# Patient Record
Sex: Female | Born: 1955 | Race: Black or African American | Hispanic: No | Smoking: Never smoker
Health system: Southern US, Community
[De-identification: ages and names within clinical notes are randomized; demographics above are authoritative.]

## PROBLEM LIST (undated history)

## (undated) DIAGNOSIS — N852 Hypertrophy of uterus: Secondary | ICD-10-CM

## (undated) DIAGNOSIS — N950 Postmenopausal bleeding: Secondary | ICD-10-CM

## (undated) DIAGNOSIS — T8859XA Other complications of anesthesia, initial encounter: Secondary | ICD-10-CM

## (undated) DIAGNOSIS — E66813 Obesity, class 3: Secondary | ICD-10-CM

## (undated) DIAGNOSIS — C541 Malignant neoplasm of endometrium: Secondary | ICD-10-CM

## (undated) DIAGNOSIS — L293 Anogenital pruritus, unspecified: Secondary | ICD-10-CM

## (undated) HISTORY — DX: Anogenital pruritus, unspecified: L29.3

## (undated) HISTORY — DX: Malignant neoplasm of endometrium: C54.1

## (undated) HISTORY — DX: Morbid (severe) obesity due to excess calories: E66.01

## (undated) HISTORY — DX: Postmenopausal bleeding: N95.0

## (undated) HISTORY — DX: Hypertrophy of uterus: N85.2

## (undated) HISTORY — DX: Obesity, class 3: E66.813

---

## 2000-05-12 ENCOUNTER — Emergency Department (HOSPITAL_COMMUNITY): Admission: EM | Admit: 2000-05-12 | Discharge: 2000-05-12 | Payer: Self-pay | Admitting: *Deleted

## 2005-06-12 ENCOUNTER — Emergency Department (HOSPITAL_COMMUNITY): Admission: EM | Admit: 2005-06-12 | Discharge: 2005-06-12 | Payer: Self-pay | Admitting: Emergency Medicine

## 2005-06-20 ENCOUNTER — Emergency Department (HOSPITAL_COMMUNITY): Admission: EM | Admit: 2005-06-20 | Discharge: 2005-06-20 | Payer: Self-pay | Admitting: Emergency Medicine

## 2016-12-26 ENCOUNTER — Encounter (HOSPITAL_COMMUNITY): Payer: Self-pay | Admitting: Emergency Medicine

## 2016-12-26 ENCOUNTER — Emergency Department (HOSPITAL_COMMUNITY): Payer: No Typology Code available for payment source

## 2016-12-26 ENCOUNTER — Emergency Department (HOSPITAL_COMMUNITY)
Admission: EM | Admit: 2016-12-26 | Discharge: 2016-12-26 | Disposition: A | Discharge status: Home / Self Care | Payer: No Typology Code available for payment source | Attending: Emergency Medicine | Admitting: Emergency Medicine

## 2016-12-26 DIAGNOSIS — Y999 Unspecified external cause status: Secondary | ICD-10-CM | POA: Insufficient documentation

## 2016-12-26 DIAGNOSIS — S39012A Strain of muscle, fascia and tendon of lower back, initial encounter: Secondary | ICD-10-CM | POA: Diagnosis not present

## 2016-12-26 DIAGNOSIS — S3992XA Unspecified injury of lower back, initial encounter: Secondary | ICD-10-CM | POA: Diagnosis present

## 2016-12-26 DIAGNOSIS — Y939 Activity, unspecified: Secondary | ICD-10-CM | POA: Diagnosis not present

## 2016-12-26 DIAGNOSIS — Y92410 Unspecified street and highway as the place of occurrence of the external cause: Secondary | ICD-10-CM | POA: Diagnosis not present

## 2016-12-26 MED ORDER — TRAMADOL HCL 50 MG PO TABS: 50.0000 mg | ORAL_TABLET | Freq: Four times a day (QID) | ORAL | 0 refills | Status: DC | PRN

## 2016-12-26 NOTE — ED Notes (Signed)
EDP aware of bp and says he discussed pt's bp with pt.

## 2016-12-26 NOTE — Discharge Instructions (Signed)
Follow up with a family md or dr. Romeo Apple if any problems
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
## 2016-12-29 ENCOUNTER — Encounter (HOSPITAL_COMMUNITY): Payer: Self-pay | Admitting: Adult Health

## 2016-12-29 ENCOUNTER — Emergency Department (HOSPITAL_COMMUNITY)
Admission: EM | Admit: 2016-12-29 | Discharge: 2016-12-29 | Disposition: A | Discharge status: Home / Self Care | Payer: No Typology Code available for payment source | Attending: Emergency Medicine | Admitting: Emergency Medicine

## 2016-12-29 ENCOUNTER — Emergency Department (HOSPITAL_COMMUNITY): Payer: No Typology Code available for payment source

## 2016-12-29 DIAGNOSIS — S161XXA Strain of muscle, fascia and tendon at neck level, initial encounter: Secondary | ICD-10-CM | POA: Diagnosis not present

## 2016-12-29 DIAGNOSIS — S199XXA Unspecified injury of neck, initial encounter: Secondary | ICD-10-CM | POA: Diagnosis present

## 2016-12-29 DIAGNOSIS — Y939 Activity, unspecified: Secondary | ICD-10-CM | POA: Insufficient documentation

## 2016-12-29 DIAGNOSIS — Z79899 Other long term (current) drug therapy: Secondary | ICD-10-CM | POA: Insufficient documentation

## 2016-12-29 DIAGNOSIS — Y999 Unspecified external cause status: Secondary | ICD-10-CM | POA: Insufficient documentation

## 2016-12-29 DIAGNOSIS — Y929 Unspecified place or not applicable: Secondary | ICD-10-CM | POA: Insufficient documentation

## 2016-12-29 MED ORDER — METHOCARBAMOL 500 MG PO TABS: 500.0000 mg | ORAL_TABLET | Freq: Three times a day (TID) | ORAL | 0 refills | Status: DC
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
## 2016-12-29 NOTE — Discharge Instructions (Signed)
Continue taking your ultram as directed.  Apply ice packs on/off to your neck.  Follow-up with your doctor or return here for any worsening symptoms

## 2016-12-30 ENCOUNTER — Telehealth: Payer: Self-pay | Admitting: Orthopedic Surgery

## 2016-12-30 NOTE — Telephone Encounter (Signed)
Patient stopped by our office 12/29/16 to inquire about appointment following Emergency room visit at [facility]. Offered appointment and discussed insurance information presented by patient, which is [employer] party [employer]. Relayed our office protocol; patient did not wish to wait for [place] to further discuss the scheduling of appointment, and left office.
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
## 2023-03-16 HISTORY — PX: DILATION AND CURETTAGE OF UTERUS: SHX78

## 2023-03-24 ENCOUNTER — Encounter: Payer: Self-pay | Admitting: Gynecologic Oncology

## 2023-03-24 ENCOUNTER — Telehealth: Payer: Self-pay

## 2023-03-24 NOTE — Telephone Encounter (Signed)
LVM for Carolyn Bradley to call our office regarding referral from Dr. Ralph Dowdy. Available opening for Friday 03/27/23.

## 2023-03-24 NOTE — Telephone Encounter (Signed)
Spoke with Carolyn Bradley regarding her referral to GYN oncology. She has an appointment scheduled with Carolyn Bradley on 03/27/23 at 11:15. Patient agrees to date and time. She has been provided with office address and location. She is also aware of our mask and visitor policy. Patient verbalized understanding and will call with any questions.

## 2023-03-27 ENCOUNTER — Telehealth: Payer: Self-pay | Admitting: *Deleted

## 2023-03-27 ENCOUNTER — Inpatient Hospital Stay: Payer: Medicare Other | Attending: Gynecologic Oncology | Admitting: Gynecologic Oncology

## 2023-03-27 ENCOUNTER — Inpatient Hospital Stay: Payer: Medicare Other

## 2023-03-27 ENCOUNTER — Inpatient Hospital Stay (HOSPITAL_BASED_OUTPATIENT_CLINIC_OR_DEPARTMENT_OTHER): Payer: Medicare Other | Admitting: Gynecologic Oncology

## 2023-03-27 ENCOUNTER — Encounter: Payer: Self-pay | Admitting: Gynecologic Oncology

## 2023-03-27 ENCOUNTER — Encounter: Payer: Self-pay | Admitting: Oncology

## 2023-03-27 VITALS — BP 132/78

## 2023-03-27 VITALS — BP 172/92 | HR 93 | Temp 98.8°F | Resp 19 | Ht 68.0 in | Wt 248.2 lb

## 2023-03-27 DIAGNOSIS — C541 Malignant neoplasm of endometrium: Secondary | ICD-10-CM

## 2023-03-27 DIAGNOSIS — N950 Postmenopausal bleeding: Secondary | ICD-10-CM | POA: Diagnosis not present

## 2023-03-27 DIAGNOSIS — Z780 Asymptomatic menopausal state: Secondary | ICD-10-CM | POA: Diagnosis not present

## 2023-03-27 DIAGNOSIS — Z800 Family history of malignant neoplasm of digestive organs: Secondary | ICD-10-CM | POA: Diagnosis not present

## 2023-03-27 DIAGNOSIS — Z809 Family history of malignant neoplasm, unspecified: Secondary | ICD-10-CM | POA: Diagnosis not present

## 2023-03-27 DIAGNOSIS — E669 Obesity, unspecified: Secondary | ICD-10-CM | POA: Diagnosis not present

## 2023-03-27 DIAGNOSIS — Z6837 Body mass index (BMI) 37.0-37.9, adult: Secondary | ICD-10-CM | POA: Diagnosis not present

## 2023-03-27 LAB — CMP (CANCER CENTER ONLY)
ALT: 12 U/L (ref 0–44)
AST: 13 U/L — ABNORMAL LOW (ref 15–41)
Albumin: 4 g/dL (ref 3.5–5.0)
Alkaline Phosphatase: 94 U/L (ref 38–126)
Anion gap: 4 — ABNORMAL LOW (ref 5–15)
BUN: 10 mg/dL (ref 8–23)
CO2: 29 mmol/L (ref 22–32)
Calcium: 9.7 mg/dL (ref 8.9–10.3)
Chloride: 106 mmol/L (ref 98–111)
Creatinine: 0.7 mg/dL (ref 0.44–1.00)
GFR, Estimated: >60 mL/min (ref >60)
Glucose, Bld: 92 mg/dL (ref 70–99)
Potassium: 4.5 mmol/L (ref 3.5–5.1)
Sodium: 139 mmol/L (ref 135–145)
Total Bilirubin: 0.2 mg/dL (ref <1.2)
Total Protein: 7.7 g/dL (ref 6.5–8.1)

## 2023-03-27 LAB — CBC (CANCER CENTER ONLY)
HCT: 35.3 % — ABNORMAL LOW (ref 36.0–46.0)
Hemoglobin: 10.8 g/dL — ABNORMAL LOW (ref 12.0–15.0)
MCH: 24.1 pg — ABNORMAL LOW (ref 26.0–34.0)
MCHC: 30.6 g/dL (ref 30.0–36.0)
MCV: 78.6 fL — ABNORMAL LOW (ref 80.0–100.0)
Platelet Count: 307 10*3/uL (ref 150–400)
RBC: 4.49 MIL/uL (ref 3.87–5.11)
RDW: 20.1 % — ABNORMAL HIGH (ref 11.5–15.5)
WBC Count: 7.2 10*3/uL (ref 4.0–10.5)
nRBC: 0 % (ref 0.0–0.2)

## 2023-03-27 NOTE — Telephone Encounter (Signed)
Spoke with Carolyn Bradley and relayed message from Dr.Tucker that Hemoglobin is a little low (anemia) but not so low that you need a blood transfusion or anything else prior to surgery. Pt verbalized understanding and thanked the office for calling.

## 2023-03-27 NOTE — Progress Notes (Signed)
GYNECOLOGIC ONCOLOGY NEW PATIENT CONSULTATION   Patient Name: Carolyn Bradley  Patient Age: 67 y.o. Date of Service: 03/27/23 Referring Provider: Ernestina Penna, MD [address] 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  We reviewed the nature of endometrial cancer and its recommended surgical staging, including total hysterectomy, bilateral salpingo-oophorectomy, and lymph node assessment. The patient is a suitable candidate for staging via a minimally invasive approach to surgery.  We reviewed that robotic assistance would be used to complete the surgery.   We discussed that most endometrial cancer is detected early, however, we reviewed that adjuvant therapy will likely be recommended based on the patient's biopsy, however, we will defer to final pathology results.    Given her high risk histology, I recommend CT scan preoperatively to rule out metastatic disease.  We reviewed the sentinel lymph node technique. Risks and benefits of sentinel lymph node biopsy was reviewed. We reviewed the technique and ICG dye. The patient DOES NOT have an iodine allergy or known liver dysfunction. We reviewed the false negative rate (0.4%), and that 3% of patients with metastatic disease will not have it detected by SLN biopsy in endometrial cancer. A low risk of allergic reaction to the dye, <0.2% for ICG, has been reported. We also discussed that in the case of failed mapping, which occurs 40% of the time, a bilateral or unilateral lymphadenectomy will be performed at the surgeon's discretion.   Potential benefits of sentinel nodes including a higher detection rate for metastasis due to ultrastaging and potential reduction in operative morbidity. However, there remains uncertainty as to the role for treatment of micrometastatic disease.  Further, the benefit of operative morbidity associated with the SLN technique in endometrial cancer is not yet completely known. In other patient populations (e.g. the cervical cancer population) there has been observed reductions in morbidity with SLN biopsy compared to pelvic lymphadenectomy. Lymphedema, nerve dysfunction and lymphocysts are all potential risks with the SLN technique as with complete lymphadenectomy. Additional risks to the patient include the risk of damage to an internal organ while operating in an altered view (e.g. the black and white image of the robotic fluorescence imaging mode).   Discussed the plan for a robotic assisted hysterectomy, bilateral salpingo-oophorectomy, sentinel lymph node evaluation, possible lymph node dissection, omentectomy, possible laparotomy. The risks of surgery were discussed in detail and she understands these to include infection; wound separation; hernia; vaginal cuff separation, injury to adjacent organs such as bowel, bladder, blood vessels, ureters and nerves; bleeding which may require blood transfusion; anesthesia risk; thromboembolic events; possible death; unforeseen complications; possible need for re-exploration; medical complications such as heart attack, stroke, pleural effusion and pneumonia; and, if full lymphadenectomy is performed the risk of lymphedema and lymphocyst. The patient will receive DVT and antibiotic prophylaxis as indicated. She voiced a clear understanding. She had the opportunity to ask questions. Perioperative instructions were reviewed with her. Prescriptions for post-op medications were sent to her pharmacy of choice.  Patient does not currently have a primary care provider.  My office was able to get her scheduled for a PCP appointment to establish care next week.  Plan for CA125 today as well as CBC given quantity of bleeding.  She is not having any symptoms of anemia.  A copy of this note was sent to the patient's  referring provider.  67 minutes of total time was spent for this patient encounter, including preparation, face-to-face counseling with the patient and coordination of care, and documentation of the encounter.  Eugene Garnet, MD  Division of Gynecologic Oncology  Department of Obstetrics and Gynecology  [facility]  ___________________________________________  Chief Complaint: No chief complaint on file.   History of Present Illness:  Carolyn Bradley is a 67 y.o. y.o. female who is seen in consultation at the request of Ernestina Penna, MD for an evaluation of endometrial cancer.  Patient denies ever going through menopause.  She notes having monthly menstrual cycles until July.  Several days after her menstrual cycle ended in July she began bleeding again like a menses.  She has basically been bleeding without interruption since then.  Bleeding became quite heavy in late July and August with passage of clots that were half the size of her hand.  She notes some associated cramping.  During July and August, she would not change overnight or bladder pads every hour that were saturated.  Her bleeding has significantly decreased since her recent procedure.  She endorses a good appetite without nausea or emesis.  She notes normal bowel bladder function.  She denies any dizziness or lightheadedness.  Patient does not have a primary care provider or get routine health care.  She checks her blood pressure at home and notes that it normally runs 117-120/60-80.  Baseline weight was approximately 145 until her 40s and she notes steadily increasing weight since that time.  Treatment History: Oncology History Overview Note  The patient was initially seen on 02/05/2023 for postmenopausal bleeding.  This was described as heavy vaginal bleeding over 4 months.  At that time she denied ever having sensation of her menses but that current, heavier bleeding started in June 2024.  This  was described as heavy bleeding with passage of clots initially, requiring the use of 5-6 overnight pads during the day and 1 change overnight.   Endometrial cancer (HCC)  02/18/2023 Imaging   Pelvic ultrasound exam: Uterus measures 7.7 x 6.5 x 7.2 cm.  Multiple calcified uterine fibroids noted, largest measuring up to 2.5 cm. Endometrium 3.8 cm, diffusely heterogenous. Right ovary normal in appearance.  Left ovary not visualized. No free fluid.   03/16/2023 Surgery   EUA, D&C for persistent PMB    03/16/2023 Pathology Results   Endometrial curettings: Mixed endometrioid and serous carcinoma.  Strong block positivity for p16.  P53 appears wild-type.   03/27/2023 Initial Diagnosis   Endometrial cancer (HCC)    PAST MEDICAL HISTORY:  Past Medical History:  Diagnosis Date   Endometrial cancer (HCC)    Enlarged uterus    Genital pruritus    Obesity, Class III, BMI 40-49.9 (morbid obesity) (HCC)    Post-menopausal bleeding      PAST SURGICAL HISTORY:  Past Surgical History:  Procedure Laterality Date   DILATION AND CURETTAGE OF UTERUS  03/16/2023   [facility]    OB/GYN HISTORY:  OB History  Gravida Para Term Preterm AB Living  2 2          SAB IAB Ectopic Multiple Live Births               # Outcome Date GA Lbr Len/2nd Weight Sex Type Anes PTL Lv  2 Para           1 Para             No LMP recorded. Patient is  postmenopausal.  Age at menarche: 81  Age at menopause: see HPI Hx of HRT: denies Hx of STDs: denies Last pap: 1990s History of abnormal pap smears: denies  SCREENING STUDIES:  Last mammogram: has never had  Last colonoscopy: has never had Last bone mineral density: has never had  MEDICATIONS: Outpatient Encounter Medications as of 03/27/2023  Medication Sig   [DISCONTINUED] methocarbamol (ROBAXIN) 500 MG tablet Take 1 tablet (500 mg total) by mouth 3 (three) times daily.   [DISCONTINUED] traMADol (ULTRAM) 50 MG tablet Take 1 tablet (50 mg  total) by mouth every 6 (six) hours as needed.   No facility-administered encounter medications on file as of 03/27/2023.    ALLERGIES:  Allergies  Allergen Reactions   Menthol Anaphylaxis     FAMILY HISTORY:  Family History  Problem Relation Age of Onset   Colon cancer Mother    Cancer Maternal Grandfather      SOCIAL HISTORY:  Social Connections: Not on file    REVIEW OF SYSTEMS:  + weight gain, vaginal bleeding Denies appetite changes, fevers, chills, fatigue. Denies hearing loss, neck lumps or masses, mouth sores, ringing in ears or voice changes. Denies cough or wheezing.  Denies shortness of breath. Denies chest pain or palpitations. Denies leg swelling. Denies abdominal distention, pain, blood in stools, constipation, diarrhea, nausea, vomiting, or early satiety. Denies pain with intercourse, dysuria, frequency, hematuria or incontinence. Denies hot flashes, pelvic pain or vaginal discharge.   Denies joint pain, back pain or muscle pain/cramps. Denies itching, rash, or wounds. Denies dizziness, headaches, numbness or seizures. Denies swollen lymph nodes or glands, denies easy bruising or bleeding. Denies anxiety, depression, confusion, or decreased concentration.  Physical Exam:  Vital Signs for this encounter:  Blood pressure (!) 172/92, pulse 93, temperature 98.8 F (37.1 C), temperature source Oral, resp. rate 19, height 5\' 8"  (1.727 m), weight 248 lb 3.2 oz (112.6 kg), SpO2 100%. Body mass index is 37.74 kg/m. General: Alert, oriented, no acute distress.  HEENT: Normocephalic, atraumatic. Sclera anicteric.  Chest: Clear to auscultation bilaterally. No wheezes, rhonchi, or rales. Cardiovascular: Regular rate and rhythm, no murmurs, rubs, or gallops.  Abdomen: Obese. Normoactive bowel sounds. Soft, nondistended, nontender to palpation. No masses or hepatosplenomegaly appreciated. No palpable fluid wave.  Extremities: Grossly normal range of motion. Warm, well  perfused. No edema bilaterally.  Skin: No rashes or lesions.  Lymphatics: No cervical, supraclavicular, or inguinal adenopathy.  GU:  Normal external female genitalia.  No lesions. No discharge or bleeding.             Bladder/urethra:  No lesions or masses, well supported bladder             Vagina: Well-rugated, no lesions seen.  Approximately 50 cc of blood within the vaginal vault.             Cervix: Normal appearing, no lesions.  Mildly firm on palpation.             Uterus: Small, mobile, no parametrial involvement or nodularity.             Adnexa: No masses appreciated.  Rectal: Deferred.  LABORATORY AND RADIOLOGIC DATA:  Outside medical records were reviewed to synthesize the above history, along with the history and physical obtained during the visit.   No results found for: "WBC", "HGB", "HCT", "PLT", "GLUCOSE", "CHOL", "TRIG", "HDL", "LDLDIRECT", "LDLCALC", "ALT", "AST", "NA", "K", "CL", "CREATININE", "BUN", "CO2", "TSH", "PSA", "INR", "GLUF", "HGBA1C", "MICROALBUR"

## 2023-03-27 NOTE — Progress Notes (Signed)
Please let her know that her heme open is a little low (she has anemia) but not so low that she needs a blood transfusion or anything else prior to surgery.

## 2023-03-27 NOTE — H&P (View-Only) (Signed)
 GYNECOLOGIC ONCOLOGY NEW PATIENT CONSULTATION   Patient Name: Carolyn Bradley  Patient Age: 67 y.o. Date of Service: 03/27/23 Referring Provider: Ernestina Penna, MD [address] 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  We reviewed the nature of endometrial cancer and its recommended surgical staging, including total hysterectomy, bilateral salpingo-oophorectomy, and lymph node assessment. The patient is a suitable candidate for staging via a minimally invasive approach to surgery.  We reviewed that robotic assistance would be used to complete the surgery.   We discussed that most endometrial cancer is detected early, however, we reviewed that adjuvant therapy will likely be recommended based on the patient's biopsy, however, we will defer to final pathology results.    Given her high risk histology, I recommend CT scan preoperatively to rule out metastatic disease.  We reviewed the sentinel lymph node technique. Risks and benefits of sentinel lymph node biopsy was reviewed. We reviewed the technique and ICG dye. The patient DOES NOT have an iodine allergy or known liver dysfunction. We reviewed the false negative rate (0.4%), and that 3% of patients with metastatic disease will not have it detected by SLN biopsy in endometrial cancer. A low risk of allergic reaction to the dye, <0.2% for ICG, has been reported. We also discussed that in the case of failed mapping, which occurs 40% of the time, a bilateral or unilateral lymphadenectomy will be performed at the surgeon's discretion.   Potential benefits of sentinel nodes including a higher detection rate for metastasis due to ultrastaging and potential reduction in operative morbidity. However, there remains uncertainty as to the role for treatment of micrometastatic disease.  Further, the benefit of operative morbidity associated with the SLN technique in endometrial cancer is not yet completely known. In other patient populations (e.g. the cervical cancer population) there has been observed reductions in morbidity with SLN biopsy compared to pelvic lymphadenectomy. Lymphedema, nerve dysfunction and lymphocysts are all potential risks with the SLN technique as with complete lymphadenectomy. Additional risks to the patient include the risk of damage to an internal organ while operating in an altered view (e.g. the black and white image of the robotic fluorescence imaging mode).   Discussed the plan for a robotic assisted hysterectomy, bilateral salpingo-oophorectomy, sentinel lymph node evaluation, possible lymph node dissection, omentectomy, possible laparotomy. The risks of surgery were discussed in detail and she understands these to include infection; wound separation; hernia; vaginal cuff separation, injury to adjacent organs such as bowel, bladder, blood vessels, ureters and nerves; bleeding which may require blood transfusion; anesthesia risk; thromboembolic events; possible death; unforeseen complications; possible need for re-exploration; medical complications such as heart attack, stroke, pleural effusion and pneumonia; and, if full lymphadenectomy is performed the risk of lymphedema and lymphocyst. The patient will receive DVT and antibiotic prophylaxis as indicated. She voiced a clear understanding. She had the opportunity to ask questions. Perioperative instructions were reviewed with her. Prescriptions for post-op medications were sent to her pharmacy of choice.  Patient does not currently have a primary care provider.  My office was able to get her scheduled for a PCP appointment to establish care next week.  Plan for CA125 today as well as CBC given quantity of bleeding.  She is not having any symptoms of anemia.  A copy of this note was sent to the patient's  referring provider.  70 minutes of total time was spent for this patient encounter, including preparation, face-to-face counseling with the patient and coordination of care, and documentation of the encounter.  Eugene Garnet, MD  Division of Gynecologic Oncology  Department of Obstetrics and Gynecology  [facility]  ___________________________________________  Chief Complaint: No chief complaint on file.   History of Present Illness:  Carolyn Bradley is a 67 y.o. y.o. female who is seen in consultation at the request of Ernestina Penna, MD for an evaluation of endometrial cancer.  Patient denies ever going through menopause.  She notes having monthly menstrual cycles until July.  Several days after her menstrual cycle ended in July she began bleeding again like a menses.  She has basically been bleeding without interruption since then.  Bleeding became quite heavy in late July and August with passage of clots that were half the size of her hand.  She notes some associated cramping.  During July and August, she would not change overnight or bladder pads every hour that were saturated.  Her bleeding has significantly decreased since her recent procedure.  She endorses a good appetite without nausea or emesis.  She notes normal bowel bladder function.  She denies any dizziness or lightheadedness.  Patient does not have a primary care provider or get routine health care.  She checks her blood pressure at home and notes that it normally runs 117-120/60-80.  Baseline weight was approximately 145 until her 40s and she notes steadily increasing weight since that time.  Treatment History: Oncology History Overview Note  The patient was initially seen on 02/05/2023 for postmenopausal bleeding.  This was described as heavy vaginal bleeding over 4 months.  At that time she denied ever having sensation of her menses but that current, heavier bleeding started in June 2024.  This  was described as heavy bleeding with passage of clots initially, requiring the use of 5-6 overnight pads during the day and 1 change overnight.   Endometrial cancer (HCC)  02/18/2023 Imaging   Pelvic ultrasound exam: Uterus measures 7.7 x 6.5 x 7.2 cm.  Multiple calcified uterine fibroids noted, largest measuring up to 2.5 cm. Endometrium 3.8 cm, diffusely heterogenous. Right ovary normal in appearance.  Left ovary not visualized. No free fluid.   03/16/2023 Surgery   EUA, D&C for persistent PMB    03/16/2023 Pathology Results   Endometrial curettings: Mixed endometrioid and serous carcinoma.  Strong block positivity for p16.  P53 appears wild-type.   03/27/2023 Initial Diagnosis   Endometrial cancer (HCC)    PAST MEDICAL HISTORY:  Past Medical History:  Diagnosis Date   Endometrial cancer (HCC)    Enlarged uterus    Genital pruritus    Obesity, Class III, BMI 40-49.9 (morbid obesity) (HCC)    Post-menopausal bleeding      PAST SURGICAL HISTORY:  Past Surgical History:  Procedure Laterality Date   DILATION AND CURETTAGE OF UTERUS  03/16/2023   [facility]    OB/GYN HISTORY:  OB History  Gravida Para Term Preterm AB Living  2 2          SAB IAB Ectopic Multiple Live Births               # Outcome Date GA Lbr Len/2nd Weight Sex Type Anes PTL Lv  2 Para           1 Para             No LMP recorded. Patient is  postmenopausal.  Age at menarche: 81  Age at menopause: see HPI Hx of HRT: denies Hx of STDs: denies Last pap: 1990s History of abnormal pap smears: denies  SCREENING STUDIES:  Last mammogram: has never had  Last colonoscopy: has never had Last bone mineral density: has never had  MEDICATIONS: Outpatient Encounter Medications as of 03/27/2023  Medication Sig   [DISCONTINUED] methocarbamol (ROBAXIN) 500 MG tablet Take 1 tablet (500 mg total) by mouth 3 (three) times daily.   [DISCONTINUED] traMADol (ULTRAM) 50 MG tablet Take 1 tablet (50 mg  total) by mouth every 6 (six) hours as needed.   No facility-administered encounter medications on file as of 03/27/2023.    ALLERGIES:  Allergies  Allergen Reactions   Menthol Anaphylaxis     FAMILY HISTORY:  Family History  Problem Relation Age of Onset   Colon cancer Mother    Cancer Maternal Grandfather      SOCIAL HISTORY:  Social Connections: Not on file    REVIEW OF SYSTEMS:  + weight gain, vaginal bleeding Denies appetite changes, fevers, chills, fatigue. Denies hearing loss, neck lumps or masses, mouth sores, ringing in ears or voice changes. Denies cough or wheezing.  Denies shortness of breath. Denies chest pain or palpitations. Denies leg swelling. Denies abdominal distention, pain, blood in stools, constipation, diarrhea, nausea, vomiting, or early satiety. Denies pain with intercourse, dysuria, frequency, hematuria or incontinence. Denies hot flashes, pelvic pain or vaginal discharge.   Denies joint pain, back pain or muscle pain/cramps. Denies itching, rash, or wounds. Denies dizziness, headaches, numbness or seizures. Denies swollen lymph nodes or glands, denies easy bruising or bleeding. Denies anxiety, depression, confusion, or decreased concentration.  Physical Exam:  Vital Signs for this encounter:  Blood pressure (!) 172/92, pulse 93, temperature 98.8 F (37.1 C), temperature source Oral, resp. rate 19, height 5\' 8"  (1.727 m), weight 248 lb 3.2 oz (112.6 kg), SpO2 100%. Body mass index is 37.74 kg/m. General: Alert, oriented, no acute distress.  HEENT: Normocephalic, atraumatic. Sclera anicteric.  Chest: Clear to auscultation bilaterally. No wheezes, rhonchi, or rales. Cardiovascular: Regular rate and rhythm, no murmurs, rubs, or gallops.  Abdomen: Obese. Normoactive bowel sounds. Soft, nondistended, nontender to palpation. No masses or hepatosplenomegaly appreciated. No palpable fluid wave.  Extremities: Grossly normal range of motion. Warm, well  perfused. No edema bilaterally.  Skin: No rashes or lesions.  Lymphatics: No cervical, supraclavicular, or inguinal adenopathy.  GU:  Normal external female genitalia.  No lesions. No discharge or bleeding.             Bladder/urethra:  No lesions or masses, well supported bladder             Vagina: Well-rugated, no lesions seen.  Approximately 50 cc of blood within the vaginal vault.             Cervix: Normal appearing, no lesions.  Mildly firm on palpation.             Uterus: Small, mobile, no parametrial involvement or nodularity.             Adnexa: No masses appreciated.  Rectal: Deferred.  LABORATORY AND RADIOLOGIC DATA:  Outside medical records were reviewed to synthesize the above history, along with the history and physical obtained during the visit.   No results found for: "WBC", "HGB", "HCT", "PLT", "GLUCOSE", "CHOL", "TRIG", "HDL", "LDLDIRECT", "LDLCALC", "ALT", "AST", "NA", "K", "CL", "CREATININE", "BUN", "CO2", "TSH", "PSA", "INR", "GLUF", "HGBA1C", "MICROALBUR"

## 2023-03-27 NOTE — Progress Notes (Signed)
Patient here for a pre-operative appointment prior to her scheduled surgery on 04/08/2023. She is scheduled for a robotic assisted total laparoscopic hysterectomy, bilateral salpingo-oophorectomy, sentinel lymph node biopsy, possible lymph node dissection, possible laparotomy, omentectomy.  The surgery was discussed in detail.  See after visit summary for additional details.    Discussed post-op pain management in detail including the aspects of the enhanced recovery pathway.  Advised her that a new prescription would be sent in for Tramadol and it is only to be used for after her upcoming surgery.  We discussed the use of tylenol post-op and to monitor for a maximum of 4,000 mg in a 24 hour period.  Also prescribed sennakot to be used after surgery and to hold if having loose stools.  Discussed bowel regimen in detail.     Discussed the use of SCDs and measures to take at home to prevent DVT including frequent mobility.  Reportable signs and symptoms of DVT discussed. Post-operative instructions discussed and expectations for after surgery. Incisional care discussed as well including reportable signs and symptoms including erythema, drainage, wound separation.     30 minutes spent with the patient.  Verbalizing understanding of material discussed. No needs or concerns voiced at the end of the visit.   Advised patient to call for any needs.  Advised that her post-operative medications had been prescribed and could be picked up at any time.    This appointment is included in the global surgical bundle as pre-operative teaching and has no charge.

## 2023-03-27 NOTE — Patient Instructions (Signed)
Plan on having a CT scan of the chest, abdomen and pelvis. Do not eat or drink 4 hours before the scan and arrive 2 hours before to begin drinking oral contrast.  We have also set you up for a new patient appointment with a primary care provider.  We will check labs today due to the bleeding and will notify you of the results.   Preparing for your Surgery  Plan for surgery on April 08, 2023 with Dr. Eugene Garnet at [facility]. You will be scheduled for robotic assisted total laparoscopic hysterectomy (removal of the uterus and cervix), bilateral salpingo-oophorectomy (removal of both ovaries and fallopian tubes), sentinel lymph node biopsy, possible lymph node dissection, possible laparotomy (larger incision on your abdomen if needed), omentectomy.   Pre-operative Testing -You will receive a phone call from presurgical testing at [facility] to arrange for a pre-operative appointment and lab work.  -Bring your insurance card, copy of an advanced directive if applicable, medication list  -At that visit, you will be asked to sign a consent for a possible blood transfusion in case a transfusion becomes necessary during surgery.  The need for a blood transfusion is rare but having consent is a necessary part of your care.     -You should not be taking blood thinners or aspirin at least ten days prior to surgery unless instructed by your surgeon.  -Do not take supplements such as fish oil (omega 3), red yeast rice, turmeric before your surgery. You want to avoid medications with aspirin in them including headache powders such as BC or Goody's), Excedrin migraine.  Day Before Surgery at Home -You will be asked to take in a light diet the day before surgery. You will be advised you can have clear liquids up until 3 hours before your surgery.    Eat a light diet the day before surgery.  Examples including soups, broths, toast, yogurt, mashed potatoes.  AVOID GAS PRODUCING  FOODS AND BEVERAGES. Things to avoid include carbonated beverages (fizzy beverages, sodas), raw fruits and raw vegetables (uncooked), or beans.   If your bowels are filled with gas, your surgeon will have difficulty visualizing your pelvic organs which increases your surgical risks.  Your role in recovery Your role is to become active as soon as directed by your doctor, while still giving yourself time to heal.  Rest when you feel tired. You will be asked to do the following in order to speed your recovery:  - Cough and breathe deeply. This helps to clear and expand your lungs and can prevent pneumonia after surgery.  - STAY ACTIVE WHEN YOU GET HOME. Do mild physical activity. Walking or moving your legs help your circulation and body functions return to normal. Do not try to get up or walk alone the first time after surgery.   -If you develop swelling on one leg or the other, pain in the back of your leg, redness/warmth in one of your legs, please call the office or go to the Emergency Room to have a doppler to rule out a blood clot. For shortness of breath, chest pain-seek care in the Emergency Room as soon as possible. - Actively manage your pain. Managing your pain lets you move in comfort. We will ask you to rate your pain on a scale of zero to 10. It is your responsibility to tell your doctor or nurse where and how much you hurt so your pain can be treated.  Special Considerations -If  you are diabetic, you may be placed on insulin after surgery to have closer control over your blood sugars to promote healing and recovery.  This does not mean that you will be discharged on insulin.  If applicable, your oral antidiabetics will be resumed when you are tolerating a solid diet.  -Your final pathology results from surgery should be available around one week after surgery and the results will be relayed to you when available.  -FMLA forms can be faxed to [phone] and please allow 5-7 business  days for completion.  Pain Management After Surgery -You will be prescribed your pain medication and bowel regimen medications before surgery so that you can have these available when you are discharged from the hospital. The pain medication is for use ONLY AFTER surgery and a new prescription will not be given.   -Make sure that you have Tylenol and Ibuprofen IF YOU ARE ABLE TO TAKE THESE MEDICATIONS at home to use on a regular basis after surgery for pain control. We recommend alternating the medications every hour to six hours since they work differently and are processed in the body differently for pain relief.  -Review the attached handout on narcotic use and their risks and side effects.   Bowel Regimen -You will be prescribed Sennakot-S to take nightly to prevent constipation especially if you are taking the narcotic pain medication intermittently.  It is important to prevent constipation and drink adequate amounts of liquids. You can stop taking this medication when you are not taking pain medication and you are back on your normal bowel routine.  Risks of Surgery Risks of surgery are low but include bleeding, infection, damage to surrounding structures, re-operation, blood clots, and very rarely death.   Blood Transfusion Information (For the consent to be signed before surgery)  We will be checking your blood type before surgery so in case of emergencies, we will know what type of blood you would need.                                            WHAT IS A BLOOD TRANSFUSION?  A transfusion is the replacement of blood or some of its parts. Blood is made up of multiple cells which provide different functions. Red blood cells carry oxygen and are used for blood loss replacement. White blood cells fight against infection. Platelets control bleeding. Plasma helps clot blood. Other blood products are available for specialized needs, such as hemophilia or other clotting disorders. BEFORE  THE TRANSFUSION  Who gives blood for transfusions?  You may be able to donate blood to be used at a later date on yourself (autologous donation). Relatives can be asked to donate blood. This is generally not any safer than if you have received blood from a stranger. The same precautions are taken to ensure safety when a relative's blood is donated. Healthy volunteers who are fully evaluated to make sure their blood is safe. This is blood bank blood. Transfusion therapy is the safest it has ever been in the practice of medicine. Before blood is taken from a donor, a complete history is taken to make sure that person has no history of diseases nor engages in risky social behavior (examples are intravenous drug use or sexual activity with multiple partners). The donor's travel history is screened to minimize risk of transmitting infections, such as malaria. The donated blood  is tested for signs of infectious diseases, such as HIV and hepatitis. The blood is then tested to be sure it is compatible with you in order to minimize the chance of a transfusion reaction. If you or a relative donates blood, this is often done in anticipation of surgery and is not appropriate for emergency situations. It takes many days to process the donated blood. RISKS AND COMPLICATIONS Although transfusion therapy is very safe and saves many lives, the main dangers of transfusion include:  Getting an infectious disease. Developing a transfusion reaction. This is an allergic reaction to something in the blood you were given. Every precaution is taken to prevent this. The decision to have a blood transfusion has been considered carefully by your caregiver before blood is given. Blood is not given unless the benefits outweigh the risks.  AFTER SURGERY INSTRUCTIONS  Return to work: 4-6 weeks if applicable  Activity: 1. Be up and out of the bed during the day.  Take a nap if needed.  You may walk up steps but be careful and use  the hand rail.  Stair climbing will tire you more than you think, you may need to stop part way and rest.   2. No lifting or straining for 6 weeks over 10 pounds. No pushing, pulling, straining for 6 weeks.  3. No driving for around 1-61 days when the following criteria have been met.  Do not drive if you are taking narcotic pain medicine and make sure that your reaction time has returned.   4. You can shower as soon as the next day after surgery. Shower daily.  Use your regular soap and water (not directly on the incision) and pat your incision(s) dry afterwards; don't rub.  No tub baths or submerging your body in water until cleared by your surgeon. If you have the soap that was given to you by pre-surgical testing that was used before surgery, you do not need to use it afterwards because this can irritate your incisions.   5. No sexual activity and nothing in the vagina for 10-12 weeks.  6. You may experience a small amount of clear drainage from your incisions, which is normal.  If the drainage persists, increases, or changes color please call the office.  7. Do not use creams, lotions, or ointments such as neosporin on your incisions after surgery until advised by your surgeon because they can cause removal of the dermabond glue on your incisions.    8. You may experience vaginal spotting after surgery or when the stitches at the top of the vagina begin to dissolve.  The spotting is normal but if you experience heavy bleeding, call our office.  9. Take Tylenol or ibuprofen first for pain if you are able to take these medications and only use narcotic pain medication for severe pain not relieved by the Tylenol or Ibuprofen.  Monitor your Tylenol intake to a max of 4,000 mg in a 24 hour period. You can alternate these medications after surgery.  Diet: 1. Low sodium Heart Healthy Diet is recommended but you are cleared to resume your normal (before surgery) diet after your procedure.  2. It is  safe to use a laxative, such as Miralax or Colace, if you have difficulty moving your bowels. You have been prescribed Sennakot-S to take at bedtime every evening after surgery to keep bowel movements regular and to prevent constipation.    Wound Care: 1. Keep clean and dry.  Shower daily.  Reasons  to call the Doctor: Fever - Oral temperature greater than 100.4 degrees Fahrenheit Foul-smelling vaginal discharge Difficulty urinating Nausea and vomiting Increased pain at the site of the incision that is unrelieved with pain medicine. Difficulty breathing with or without chest pain New calf pain especially if only on one side Sudden, continuing increased vaginal bleeding with or without clots.   Contacts: For questions or concerns you should contact:  Dr. Eugene Garnet at [phone]  Warner Mccreedy, NP at [phone]  After Hours: call [phone] and have the GYN Oncologist paged/contacted (after 5 pm or on the weekends). You will speak with an after hours RN and let he or she know you have had surgery.  Messages sent via mychart are for non-urgent matters and are not responded to after hours so for urgent needs, please call the after hours number.

## 2023-03-27 NOTE — Telephone Encounter (Signed)
-----   Message from Carver Fila sent at 03/27/2023  1:03 PM EST ----- Please let her know that her heme open is a little low (she has anemia) but not so low that she needs a blood transfusion or anything else prior to surgery.

## 2023-03-29 LAB — CA 125: Cancer Antigen (CA) 125: 12.1 U/mL (ref 0.0–38.1)

## 2023-03-30 ENCOUNTER — Ambulatory Visit: Payer: Self-pay | Admitting: Family Medicine

## 2023-03-30 NOTE — Progress Notes (Signed)
Please let her know that BMP looks normal and the tumor CA-125 is normal. Thank you

## 2023-03-31 ENCOUNTER — Encounter: Payer: Self-pay | Admitting: Family Medicine

## 2023-03-31 ENCOUNTER — Telehealth: Payer: Self-pay | Admitting: *Deleted

## 2023-03-31 ENCOUNTER — Ambulatory Visit: Payer: Medicare Other | Admitting: Family Medicine

## 2023-03-31 VITALS — BP 127/70 | HR 71 | Temp 97.0°F | Wt 262.0 lb

## 2023-03-31 DIAGNOSIS — R635 Abnormal weight gain: Secondary | ICD-10-CM

## 2023-03-31 DIAGNOSIS — Z8542 Personal history of malignant neoplasm of other parts of uterus: Secondary | ICD-10-CM

## 2023-03-31 DIAGNOSIS — E669 Obesity, unspecified: Secondary | ICD-10-CM

## 2023-03-31 DIAGNOSIS — Z131 Encounter for screening for diabetes mellitus: Secondary | ICD-10-CM

## 2023-03-31 DIAGNOSIS — Z13220 Encounter for screening for lipoid disorders: Secondary | ICD-10-CM

## 2023-03-31 DIAGNOSIS — Z7689 Persons encountering health services in other specified circumstances: Secondary | ICD-10-CM

## 2023-03-31 NOTE — Telephone Encounter (Signed)
-----   Message from Carver Fila sent at 03/30/2023  6:43 AM EST ----- Please let her know that BMP looks normal and the tumor CA-125 is normal. Thank you

## 2023-03-31 NOTE — Telephone Encounter (Signed)
Spoke with Ms. Whitlow and relayed message from Dr. Pricilla Holm that patient's BMP looks normal and the tumor marker CA-125 is also normal. Pt verbalized understanding and thanked the office for calling.

## 2023-03-31 NOTE — Telephone Encounter (Signed)
Attempted to reach patient to relay message from Dr. Pricilla Holm in regards to patient's lab results. Left voicemail requesting call back.

## 2023-03-31 NOTE — Telephone Encounter (Signed)
2nd attempt to reach patient to relay lab results. Left voicemail requesting call back.

## 2023-03-31 NOTE — Progress Notes (Unsigned)
Established Patient Office Visit  Subjective   Patient ID: Carolyn Bradley, female    DOB: 12/12/1955  Age: 67 y.o. MRN: [id number]  Chief Complaint  Patient presents with   Consult    Endometrial cancer    [name] is a 67 year old female with a medical history    Patient Active Problem List   Diagnosis Date Noted   Endometrial cancer (HCC) 03/27/2023   Past Medical History:  Diagnosis Date   Endometrial cancer (HCC)    Enlarged uterus    Genital pruritus    Obesity, Class III, BMI 40-49.9 (morbid obesity) (HCC)    Post-menopausal bleeding    Past Surgical History:  Procedure Laterality Date   DILATION AND CURETTAGE OF UTERUS  03/16/2023   [facility]   Social History   Tobacco Use   Smoking status: Never  Substance Use Topics   Alcohol use: No   Drug use: No   Social History   Socioeconomic History   Marital status: Widowed    Spouse name: Not on file   Number of children: Not on file   Years of education: Not on file   Highest education level: Not on file  Occupational History   Not on file  Tobacco Use   Smoking status: Never   Smokeless tobacco: Not on file  Substance and Sexual Activity   Alcohol use: No   Drug use: No   Sexual activity: Not Currently  Other Topics Concern   Not on file  Social History Narrative   Not on file   Social Determinants of Health   Financial Resource Strain: Not on file  Food Insecurity: No Food Insecurity (02/05/2023)   Received from [facility]   Hunger Vital Sign    Worried About Running Out of Food in the Last Year: Never true    Ran Out of Food in the Last Year: Never true  Transportation Needs: No Transportation Needs (02/05/2023)   Received from [facility]    Lack of Transportation (Medical): No    Lack of Transportation (Non-Medical): No  Physical Activity: Not on file  Stress: Not on file  Social Connections: Not on file  Intimate Partner Violence: Not  At Risk (02/05/2023)   Received from [facility]   Humiliation, Afraid, Rape, and Kick questionnaire    Fear of Current or Ex-Partner: No    Emotionally Abused: No    Physically Abused: No    Sexually Abused: No   Family Status  Relation Name Status   Mother  (Not Specified)   MGF  (Not Specified)   PGF  (Not Specified)  No partnership data on file   Family History  Problem Relation Age of Onset   Cancer Mother    Colon cancer Mother    Cancer Paternal Grandfather    Allergies  Allergen Reactions   Menthol Anaphylaxis      Review of Systems  Constitutional:  Negative for chills and fever.  HENT: Negative.    Eyes: Negative.   Respiratory: Negative.    Cardiovascular: Negative.   Gastrointestinal:  Positive for abdominal pain. Negative for heartburn and nausea.  Genitourinary: Negative.   Musculoskeletal: Negative.  Negative for back pain and joint pain.  Neurological: Negative.   Endo/Heme/Allergies:  Negative for environmental allergies and polydipsia. Does not bruise/bleed easily.  Psychiatric/Behavioral: Negative.        Objective:     BP 127/70  Pulse 71   Temp (!) 97 F (36.1 C)   Wt 262 lb (118.8 kg)   SpO2 100%   BMI 39.84 kg/m  BP Readings from Last 3 Encounters:  03/31/23 127/70  03/27/23 132/78  03/27/23 (!) 172/92   Wt Readings from Last 3 Encounters:  03/31/23 262 lb (118.8 kg)  03/27/23 248 lb 3.2 oz (112.6 kg)  12/29/16 240 lb (108.9 kg)      Physical Exam Constitutional:      Appearance: She is obese.  Eyes:     Pupils: Pupils are equal, round, and reactive to light.  Cardiovascular:     Rate and Rhythm: Normal rate and regular rhythm.  Pulmonary:     Effort: Pulmonary effort is normal.  Abdominal:     General: There is distension.     Tenderness: There is no right CVA tenderness or guarding.  Musculoskeletal:        General: Normal range of motion.  Skin:    General: Skin is warm.  Neurological:     General: No  focal deficit present.     Mental Status: Mental status is at baseline.  Psychiatric:        Mood and Affect: Mood normal.        Behavior: Behavior normal.        Thought Content: Thought content normal.        Judgment: Judgment normal.      No results found for any visits on 03/31/23.  Last CBC Lab Results  Component Value Date   WBC 7.2 03/27/2023   HGB 10.8 (L) 03/27/2023   HCT 35.3 (L) 03/27/2023   MCV 78.6 (L) 03/27/2023   MCH 24.1 (L) 03/27/2023   RDW 20.1 (H) 03/27/2023   PLT 307 03/27/2023   Last metabolic panel Lab Results  Component Value Date   GLUCOSE 92 03/27/2023   NA 139 03/27/2023   K 4.5 03/27/2023   CL 106 03/27/2023   CO2 29 03/27/2023   BUN 10 03/27/2023   CREATININE 0.70 03/27/2023   GFRNONAA >60 03/27/2023   CALCIUM 9.7 03/27/2023   PROT 7.7 03/27/2023   ALBUMIN 4.0 03/27/2023   BILITOT 0.2 03/27/2023   ALKPHOS 94 03/27/2023   AST 13 (L) 03/27/2023   ALT 12 03/27/2023   ANIONGAP 4 (L) 03/27/2023   Last lipids No results found for: "CHOL", "HDL", "LDLCALC", "LDLDIRECT", "TRIG", "CHOLHDL" Last hemoglobin A1c No results found for: "HGBA1C" Last thyroid functions No results found for: "TSH", "T3TOTAL", "T4TOTAL", "THYROIDAB" Last vitamin D No results found for: "25OHVITD2", "25OHVITD3", "VD25OH" Last vitamin B12 and Folate No results found for: "VITAMINB12", "FOLATE"    The ASCVD Risk score (Arnett DK, et al., 2019) failed to calculate for the following reasons:   Cannot find a previous HDL lab   Cannot find a previous total cholesterol lab    Assessment & Plan:   Problem List Items Addressed This Visit   None   No follow-ups on file.   Nolon Nations  APRN, MSN, FNP-C Patient Care [facility] [address] [place], [place] [address] [phone]

## 2023-04-01 LAB — HEMOGLOBIN A1C
Est. average glucose Bld gHb Est-mCnc: 120 mg/dL
Hgb A1c MFr Bld: 5.8 % — ABNORMAL HIGH (ref 4.8–5.6)

## 2023-04-01 LAB — LIPID PANEL
Chol/HDL Ratio: 3.3 {ratio} (ref 0.0–4.4)
Cholesterol, Total: 204 mg/dL — ABNORMAL HIGH (ref 100–199)
HDL: 62 mg/dL (ref >39)
LDL Chol Calc (NIH): 125 mg/dL — ABNORMAL HIGH (ref 0–99)
Triglycerides: 96 mg/dL (ref 0–149)
VLDL Cholesterol Cal: 17 mg/dL (ref 5–40)

## 2023-04-01 NOTE — Progress Notes (Addendum)
Patient requested that no health information be given to family or friends.  COVID Vaccine Completed:  Date of COVID positive in last 90 days: no  PCP - no Cardiologist - n/a  CT- 04/03/23 Epic Chest x-ray - n/a EKG - n/a Stress Test - n/a ECHO - n/a Cardiac Cath - n/a Pacemaker/ICD device last checked: n/a Spinal Cord Stimulator: n/a  Bowel Prep - light diet the day before   Sleep Study - n/a CPAP -   Fasting Blood Sugar - n/a Checks Blood Sugar _____ times a day  Last dose of GLP1 agonist-  N/A GLP1 instructions:  Hold 7 days before surgery    Last dose of SGLT-2 inhibitors-  N/A SGLT-2 instructions:  Hold 3 days before surgery    Blood Thinner Instructions:  n/a Aspirin Instructions: Last Dose:  Activity level: Can go up a flight of stairs and perform activities of daily living without stopping and without symptoms of chest pain or shortness of breath.   Anesthesia review:   Patient denies shortness of breath, fever, cough and chest pain at PAT appointment  Patient verbalized understanding of instructions that were given to them at the PAT appointment. Patient was also instructed that they will need to review over the PAT instructions again at home before surgery.

## 2023-04-01 NOTE — Patient Instructions (Addendum)
SURGICAL WAITING ROOM VISITATION  Patients having surgery or a procedure may have no more than 2 support people in the waiting area - these visitors may rotate.    Children under the age of 66 must have an adult with them who is not the patient.  Due to an increase in RSV and influenza rates and associated hospitalizations, children ages 68 and under may not visit patients in [facility] ([facility]) hospitals.  If the patient needs to stay at the hospital during part of their recovery, the visitor guidelines for inpatient rooms apply. Pre-op nurse will coordinate an appropriate time for 1 support person to accompany patient in pre-op.  This support person may not rotate.    Please refer to the [facility] website for the visitor guidelines for Inpatients (after your surgery is over and you are in a regular room).    Your procedure is scheduled on: 04/08/23   Report to [facility] Main Entrance    Report to admitting at 12:45 PM   Call this number if you have problems the morning of surgery [phone]   Do not eat food :After Midnight.   After Midnight you may have the following liquids until  12:00 PM DAY OF SURGERY  Water Non-Citrus Juices (without pulp, NO RED-Apple, White grape, White cranberry) Black Coffee (NO MILK/CREAM OR CREAMERS, sugar ok)  Clear Tea (NO MILK/CREAM OR CREAMERS, sugar ok) regular and decaf                             Plain Jell-O (NO RED)                                           Fruit ices (not with fruit pulp, NO RED)                                     Popsicles (NO RED)                                                               Sports drinks like Gatorade (NO RED)          If you have questions, please contact your surgeon's office.   FOLLOW BOWEL PREP AND ANY ADDITIONAL PRE OP INSTRUCTIONS YOU RECEIVED FROM YOUR SURGEON'S OFFICE!!!     Oral Hygiene is also important to reduce your risk of infection.                                    Remember -  BRUSH YOUR TEETH THE MORNING OF SURGERY WITH YOUR REGULAR TOOTHPASTE  DENTURES WILL BE REMOVED PRIOR TO SURGERY PLEASE DO NOT APPLY "Poly grip" OR ADHESIVES!!!   Stop all vitamins and herbal supplements 7 days before surgery.   Take these medicines the morning of surgery with A SIP OF WATER: None  You may not have any metal on your body including hair pins, jewelry, and body piercing             Do not wear make-up, lotions, powders, perfumes, or deodorant  Do not wear nail polish including gel and S&S, artificial/acrylic nails, or any other type of covering on natural nails including finger and toenails. If you have artificial nails, gel coating, etc. that needs to be removed by a nail salon please have this removed prior to surgery or surgery may need to be canceled/ delayed if the surgeon/ anesthesia feels like they are unable to be safely monitored.   Do not shave  48 hours prior to surgery.    Do not bring valuables to the hospital. [facility] IS NOT             RESPONSIBLE   FOR VALUABLES.   Contacts, glasses, dentures or bridgework may not be worn into surgery  DO NOT BRING YOUR HOME MEDICATIONS TO THE HOSPITAL. PHARMACY WILL DISPENSE MEDICATIONS LISTED ON YOUR MEDICATION LIST TO YOU DURING YOUR ADMISSION IN THE HOSPITAL!    Patients discharged on the day of surgery will not be allowed to drive home.  Someone NEEDS to stay with you for the first 24 hours after anesthesia.              Please read over the following fact sheets you were given: IF YOU HAVE QUESTIONS ABOUT YOUR PRE-OP INSTRUCTIONS PLEASE CALL [name]   If you received a COVID test during your pre-op visit  it is requested that you wear a mask when out in public, stay away from anyone that may not be feeling well and notify your surgeon if you develop symptoms. If you test positive for Covid or have been in contact with anyone that has tested positive in the last 10 days  please notify you surgeon.    [facility] - Preparing for Surgery Before surgery, you can play an important role.  Because skin is not sterile, your skin needs to be as free of germs as possible.  You can reduce the number of germs on your skin by washing with CHG (chlorahexidine gluconate) soap before surgery.  CHG is an antiseptic cleaner which kills germs and bonds with the skin to continue killing germs even after washing. Please DO NOT use if you have an allergy to CHG or antibacterial soaps.  If your skin becomes reddened/irritated stop using the CHG and inform your nurse when you arrive at Short Stay. Do not shave (including legs and underarms) for at least 48 hours prior to the first CHG shower.  You may shave your face/neck.  Please follow these instructions carefully:  1.  Shower with CHG Soap the night before surgery and the  morning of surgery.  2.  If you choose to wash your hair, wash your hair first as usual with your normal  shampoo.  3.  After you shampoo, rinse your hair and body thoroughly to remove the shampoo.                             4.  Use CHG as you would any other liquid soap.  You can apply chg directly to the skin and wash.  Gently with a scrungie or clean washcloth.  5.  Apply the CHG Soap to your body ONLY FROM THE NECK DOWN.   Do   not use on face/  open                           Wound or open sores. Avoid contact with eyes, ears mouth and   genitals (private parts).                       Wash face,  Genitals (private parts) with your normal soap.             6.  Wash thoroughly, paying special attention to the area where your    surgery  will be performed.  7.  Thoroughly rinse your body with warm water from the neck down.  8.  DO NOT shower/wash with your normal soap after using and rinsing off the CHG Soap.                9.  Pat yourself dry with a clean towel.            10.  Wear clean pajamas.            11.  Place clean sheets on your bed the night of your  first shower and do not  sleep with pets. Day of Surgery : Do not apply any lotions/deodorants the morning of surgery.  Please wear clean clothes to the hospital/surgery center.  FAILURE TO FOLLOW THESE INSTRUCTIONS MAY RESULT IN THE CANCELLATION OF YOUR SURGERY  PATIENT SIGNATURE_________________________________  NURSE SIGNATURE__________________________________  ________________________________________________________________________ WHAT IS A BLOOD TRANSFUSION? Blood Transfusion Information  A transfusion is the replacement of blood or some of its parts. Blood is made up of multiple cells which provide different functions. Red blood cells carry oxygen and are used for blood loss replacement. White blood cells fight against infection. Platelets control bleeding. Plasma helps clot blood. Other blood products are available for specialized needs, such as hemophilia or other clotting disorders. BEFORE THE TRANSFUSION  Who gives blood for transfusions?  Healthy volunteers who are fully evaluated to make sure their blood is safe. This is blood bank blood. Transfusion therapy is the safest it has ever been in the practice of medicine. Before blood is taken from a donor, a complete history is taken to make sure that person has no history of diseases nor engages in risky social behavior (examples are intravenous drug use or sexual activity with multiple partners). The donor's travel history is screened to minimize risk of transmitting infections, such as malaria. The donated blood is tested for signs of infectious diseases, such as HIV and hepatitis. The blood is then tested to be sure it is compatible with you in order to minimize the chance of a transfusion reaction. If you or a relative donates blood, this is often done in anticipation of surgery and is not appropriate for emergency situations. It takes many days to process the donated blood. RISKS AND COMPLICATIONS Although transfusion therapy  is very safe and saves many lives, the main dangers of transfusion include:  Getting an infectious disease. Developing a transfusion reaction. This is an allergic reaction to something in the blood you were given. Every precaution is taken to prevent this. The decision to have a blood transfusion has been considered carefully by your caregiver before blood is given. Blood is not given unless the benefits outweigh the risks. AFTER THE TRANSFUSION Right after receiving a blood transfusion, you will usually feel much better and more energetic. This is especially true if your red blood cells have gotten low (  anemic). The transfusion raises the level of the red blood cells which carry oxygen, and this usually causes an energy increase. The nurse administering the transfusion will monitor you carefully for complications. HOME CARE INSTRUCTIONS  No special instructions are needed after a transfusion. You may find your energy is better. Speak with your caregiver about any limitations on activity for underlying diseases you may have. SEEK MEDICAL CARE IF:  Your condition is not improving after your transfusion. You develop redness or irritation at the intravenous (IV) site. SEEK IMMEDIATE MEDICAL CARE IF:  Any of the following symptoms occur over the next 12 hours: Shaking chills. You have a temperature by mouth above 102 F (38.9 C), not controlled by medicine. Chest, back, or muscle pain. People around you feel you are not acting correctly or are confused. Shortness of breath or difficulty breathing. Dizziness and fainting. You get a rash or develop hives. You have a decrease in urine output. Your urine turns a dark color or changes to pink, red, or brown. Any of the following symptoms occur over the next 10 days: You have a temperature by mouth above 102 F (38.9 C), not controlled by medicine. Shortness of breath. Weakness after normal activity. The white part of the eye turns yellow  (jaundice). You have a decrease in the amount of urine or are urinating less often. Your urine turns a dark color or changes to pink, red, or brown. Document Released: 04/18/2000 Document Revised: 07/14/2011 Document Reviewed: 12/06/2007 [facility] Patient Information 2014 [place], [place].  _______________________________________________________________________

## 2023-04-03 ENCOUNTER — Ambulatory Visit (HOSPITAL_COMMUNITY)
Admission: RE | Admit: 2023-04-03 | Discharge: 2023-04-03 | Disposition: A | Discharge status: Home / Self Care | Payer: Medicare Other | Source: Ambulatory Visit | Attending: Gynecologic Oncology

## 2023-04-03 DIAGNOSIS — C541 Malignant neoplasm of endometrium: Secondary | ICD-10-CM | POA: Diagnosis present

## 2023-04-03 MED ORDER — IOHEXOL 300 MG/ML  SOLN
100.0000 mL | Freq: Once | INTRAMUSCULAR | Status: AC | PRN
  Administered 2023-04-03: 100 mL via INTRAVENOUS

## 2023-04-03 MED ORDER — IOHEXOL 300 MG/ML  SOLN
30.0000 mL | Freq: Once | INTRAMUSCULAR | Status: AC | PRN
  Administered 2023-04-03: 30 mL via ORAL

## 2023-04-06 ENCOUNTER — Encounter (HOSPITAL_COMMUNITY): Payer: Self-pay

## 2023-04-06 ENCOUNTER — Inpatient Hospital Stay: Payer: Medicare Other | Attending: Gynecologic Oncology | Admitting: Licensed Clinical Social Worker

## 2023-04-06 ENCOUNTER — Encounter (HOSPITAL_COMMUNITY)
Admission: RE | Admit: 2023-04-06 | Discharge: 2023-04-06 | Disposition: A | Discharge status: Home / Self Care | Payer: Medicare Other | Source: Ambulatory Visit | Attending: Gynecologic Oncology

## 2023-04-06 ENCOUNTER — Other Ambulatory Visit: Payer: Self-pay

## 2023-04-06 DIAGNOSIS — C541 Malignant neoplasm of endometrium: Secondary | ICD-10-CM | POA: Diagnosis not present

## 2023-04-06 DIAGNOSIS — Z01812 Encounter for preprocedural laboratory examination: Secondary | ICD-10-CM | POA: Insufficient documentation

## 2023-04-06 HISTORY — DX: Other complications of anesthesia, initial encounter: T88.59XA

## 2023-04-06 LAB — COMPREHENSIVE METABOLIC PANEL
ALT: 13 U/L (ref 0–44)
AST: 16 U/L (ref 15–41)
Albumin: 3.6 g/dL (ref 3.5–5.0)
Alkaline Phosphatase: 85 U/L (ref 38–126)
Anion gap: 6 (ref 5–15)
BUN: 12 mg/dL (ref 8–23)
CO2: 26 mmol/L (ref 22–32)
Calcium: 9.1 mg/dL (ref 8.9–10.3)
Chloride: 105 mmol/L (ref 98–111)
Creatinine, Ser: 0.71 mg/dL (ref 0.44–1.00)
GFR, Estimated: >60 mL/min (ref >60)
Glucose, Bld: 94 mg/dL (ref 70–99)
Potassium: 4.4 mmol/L (ref 3.5–5.1)
Sodium: 137 mmol/L (ref 135–145)
Total Bilirubin: 0.4 mg/dL (ref <1.2)
Total Protein: 7.1 g/dL (ref 6.5–8.1)

## 2023-04-06 LAB — CBC
HCT: 34.4 % — ABNORMAL LOW (ref 36.0–46.0)
Hemoglobin: 10.5 g/dL — ABNORMAL LOW (ref 12.0–15.0)
MCH: 24.2 pg — ABNORMAL LOW (ref 26.0–34.0)
MCHC: 30.5 g/dL (ref 30.0–36.0)
MCV: 79.3 fL — ABNORMAL LOW (ref 80.0–100.0)
Platelets: 307 10*3/uL (ref 150–400)
RBC: 4.34 MIL/uL (ref 3.87–5.11)
RDW: 20.3 % — ABNORMAL HIGH (ref 11.5–15.5)
WBC: 5.3 10*3/uL (ref 4.0–10.5)
nRBC: 0 % (ref 0.0–0.2)

## 2023-04-06 NOTE — Progress Notes (Signed)
[facility] Clinical Social Work  Initial Assessment   Carolyn Bradley is a 67 y.o. year old female contacted by phone. Clinical Social Work was referred by new patient protocol for assessment of psychosocial needs.   SDOH (Social Determinants of Health) assessments performed: Yes SDOH Interventions    Flowsheet Row Clinical Support from 04/06/2023 in [facility] - A Dept Of [facility]  SDOH Interventions   Food Insecurity Interventions Intervention Not Indicated  Housing Interventions Intervention Not Indicated  Transportation Interventions Intervention Not Indicated  Utilities Interventions Intervention Not Indicated       SDOH Screenings   Food Insecurity: No Food Insecurity (04/06/2023)  Housing: Low Risk  (04/06/2023)  Transportation Needs: No Transportation Needs (04/06/2023)  Utilities: Not At Risk (04/06/2023)  Depression (PHQ2-9): Low Risk  (04/06/2023)  Tobacco Use: Unknown (04/06/2023)     Distress Screen completed: No     No data to display            Family/Social Information:  Housing Arrangement: patient lives alone Transportation concerns: no  Financial concerns: No Type of concern: None Food access concerns: no Religious or spiritual practice: Not known Services Currently in place:  Medicare  Coping/ Adjustment to diagnosis: Patient understands treatment plan and what happens next? yes,  has surgery this week Concerns about diagnosis and/or treatment: I'm not especially worried about anything Current coping skills/ strengths: Motivation for treatment/growth     SUMMARY: Current SDOH Barriers:  No barriers noted today  Clinical Social Work Clinical Goal(s):  No clinical social work goals at this time  Interventions: Discussed common feeling and emotions when being diagnosed with cancer, and the importance of support during treatment Informed patient of the support team roles and support services at [facility] Encouraged  patient to call with any questions or concerns   Follow Up Plan: Patient will contact CSW with any support or resource needs Patient verbalizes understanding of plan: Yes    Sheena Simonis E Emidio Warrell, LCSW Clinical Social Worker [facility]

## 2023-04-07 ENCOUNTER — Telehealth: Payer: Self-pay | Admitting: *Deleted

## 2023-04-07 ENCOUNTER — Telehealth: Payer: Self-pay

## 2023-04-07 LAB — CA 125: Cancer Antigen (CA) 125: 11.5 U/mL (ref 0.0–38.1)

## 2023-04-07 NOTE — Telephone Encounter (Signed)
Per Warner Mccreedy NP,  For her scan: in the body of the text, it says " Specific abnormal lymph node enlargement identified in the abdomen or pelvis including retroperitoneum, mesentery or pelvic sidewall." In the impression, it says "no specific". Just the fact cancer is present with this scan evaluating for metastatic disease, could someone call the reading room and get this updated/corrected.   I called the reading room and spoke to [place], relayed the message from Warner Mccreedy NP. She states Dr. Chales Abrahams is still in the office and she will pass on the message.

## 2023-04-07 NOTE — Telephone Encounter (Signed)
Telephone call to check on pre-operative status.  Patient compliant with pre-operative instructions.  Reinforced nothing to eat after midnight. Clear liquids until 1145. Patient to arrive at 1245.  No questions or concerns voiced.  Instructed to call for any needs. 

## 2023-04-08 ENCOUNTER — Observation Stay (HOSPITAL_COMMUNITY)
Admission: RE | Admit: 2023-04-08 | Discharge: 2023-04-09 | Disposition: A | Discharge status: Home / Self Care | Payer: Medicare Other | Attending: Gynecologic Oncology | Admitting: Gynecologic Oncology

## 2023-04-08 ENCOUNTER — Encounter (HOSPITAL_COMMUNITY)
Admission: RE | Disposition: A | Discharge status: Home / Self Care | Payer: Self-pay | Source: Home / Self Care | Attending: Gynecologic Oncology

## 2023-04-08 ENCOUNTER — Encounter (HOSPITAL_COMMUNITY): Payer: Self-pay | Admitting: Gynecologic Oncology

## 2023-04-08 ENCOUNTER — Other Ambulatory Visit: Payer: Self-pay

## 2023-04-08 ENCOUNTER — Ambulatory Visit (HOSPITAL_COMMUNITY): Payer: Medicare Other | Admitting: Anesthesiology

## 2023-04-08 ENCOUNTER — Ambulatory Visit (HOSPITAL_COMMUNITY): Payer: Medicare Other | Admitting: Physician Assistant

## 2023-04-08 DIAGNOSIS — Z90710 Acquired absence of both cervix and uterus: Secondary | ICD-10-CM | POA: Diagnosis present

## 2023-04-08 DIAGNOSIS — C541 Malignant neoplasm of endometrium: Secondary | ICD-10-CM

## 2023-04-08 DIAGNOSIS — D259 Leiomyoma of uterus, unspecified: Secondary | ICD-10-CM | POA: Diagnosis not present

## 2023-04-08 DIAGNOSIS — C55 Malignant neoplasm of uterus, part unspecified: Secondary | ICD-10-CM | POA: Diagnosis not present

## 2023-04-08 DIAGNOSIS — Z800 Family history of malignant neoplasm of digestive organs: Secondary | ICD-10-CM | POA: Insufficient documentation

## 2023-04-08 HISTORY — PX: ROBOTIC ASSISTED TOTAL HYSTERECTOMY WITH BILATERAL SALPINGO OOPHERECTOMY: SHX6086

## 2023-04-08 HISTORY — PX: SENTINEL NODE BIOPSY: SHX6608

## 2023-04-08 LAB — TYPE AND SCREEN
ABO/RH(D): O POS
Antibody Screen: NEGATIVE

## 2023-04-08 LAB — ABO/RH: ABO/RH(D): O POS

## 2023-04-08 SURGERY — HYSTERECTOMY, TOTAL, ROBOT-ASSISTED, LAPAROSCOPIC, WITH BILATERAL SALPINGO-OOPHORECTOMY
Anesthesia: General

## 2023-04-08 MED ORDER — HYDROMORPHONE HCL 1 MG/ML IJ SOLN
0.5000 mg | INTRAMUSCULAR | Status: DC | PRN
  Administered 2023-04-08: 0.5 mg via INTRAVENOUS
  Filled 2023-04-08: qty 0.5

## 2023-04-08 MED ORDER — SODIUM CHLORIDE 0.9% FLUSH
3.0000 mL | Freq: Two times a day (BID) | INTRAVENOUS | Status: DC
Start: 2023-04-08 — End: 2023-04-08

## 2023-04-08 MED ORDER — ACETAMINOPHEN 500 MG PO TABS
1000.0000 mg | ORAL_TABLET | ORAL | Status: AC
  Administered 2023-04-08: 1000 mg via ORAL
  Filled 2023-04-08: qty 2

## 2023-04-08 MED ORDER — LIDOCAINE HCL (PF) 2 % IJ SOLN
INTRAMUSCULAR | Status: DC | PRN
  Administered 2023-04-08: 1.5 mg/kg/h

## 2023-04-08 MED ORDER — BUPIVACAINE LIPOSOME 1.3 % IJ SUSP
INTRAMUSCULAR | Status: AC
Start: 2023-04-08 — End: ?
  Filled 2023-04-08: qty 20

## 2023-04-08 MED ORDER — OXYCODONE HCL 5 MG PO TABS
5.0000 mg | ORAL_TABLET | ORAL | Status: DC | PRN
Start: 2023-04-08 — End: 2023-04-09
  Administered 2023-04-09: 5 mg via ORAL
  Filled 2023-04-08: qty 1

## 2023-04-08 MED ORDER — STERILE WATER FOR INJECTION IJ SOLN
INTRAMUSCULAR | Status: DC | PRN
  Administered 2023-04-08: 10 mL via INTRAMUSCULAR

## 2023-04-08 MED ORDER — METRONIDAZOLE 500 MG/100ML IV SOLN
500.0000 mg | INTRAVENOUS | Status: AC
  Administered 2023-04-08: 500 mg via INTRAVENOUS
  Filled 2023-04-08: qty 100

## 2023-04-08 MED ORDER — CELECOXIB 200 MG PO CAPS
200.0000 mg | ORAL_CAPSULE | Freq: Once | ORAL | Status: AC
Start: 2023-04-08 — End: 2023-04-08
  Administered 2023-04-08: 200 mg via ORAL
  Filled 2023-04-08: qty 1

## 2023-04-08 MED ORDER — DEXAMETHASONE SODIUM PHOSPHATE 10 MG/ML IJ SOLN
INTRAMUSCULAR | Status: DC | PRN
  Administered 2023-04-08: 4 mg via INTRAVENOUS

## 2023-04-08 MED ORDER — ENOXAPARIN SODIUM 40 MG/0.4ML IJ SOSY
40.0000 mg | PREFILLED_SYRINGE | INTRAMUSCULAR | Status: DC
  Filled 2023-04-08: qty 0.4

## 2023-04-08 MED ORDER — ONDANSETRON HCL 4 MG/2ML IJ SOLN
4.0000 mg | Freq: Four times a day (QID) | INTRAMUSCULAR | Status: DC | PRN
Start: 2023-04-08 — End: 2023-04-09

## 2023-04-08 MED ORDER — PHENYLEPHRINE HCL-NACL 20-0.9 MG/250ML-% IV SOLN
INTRAVENOUS | Status: DC | PRN
Start: 2023-04-08 — End: 2023-04-08
  Administered 2023-04-08: 15 ug/min via INTRAVENOUS

## 2023-04-08 MED ORDER — ONDANSETRON HCL 4 MG/2ML IJ SOLN
INTRAMUSCULAR | Status: DC | PRN
  Administered 2023-04-08: 4 mg via INTRAVENOUS

## 2023-04-08 MED ORDER — BUPIVACAINE HCL 0.25 % IJ SOLN
INTRAMUSCULAR | Status: AC
  Filled 2023-04-08: qty 1

## 2023-04-08 MED ORDER — ROCURONIUM BROMIDE 10 MG/ML (PF) SYRINGE
PREFILLED_SYRINGE | INTRAVENOUS | Status: AC
Start: 2023-04-08 — End: ?
  Filled 2023-04-08: qty 10

## 2023-04-08 MED ORDER — LIDOCAINE HCL (PF) 2 % IJ SOLN
INTRAMUSCULAR | Status: DC | PRN
  Administered 2023-04-08: 100 mg via INTRADERMAL

## 2023-04-08 MED ORDER — LIDOCAINE HCL (PF) 2 % IJ SOLN
INTRAMUSCULAR | Status: AC
Start: 2023-04-08 — End: ?
  Filled 2023-04-08: qty 5

## 2023-04-08 MED ORDER — ROCURONIUM BROMIDE 10 MG/ML (PF) SYRINGE
PREFILLED_SYRINGE | INTRAVENOUS | Status: DC | PRN
  Administered 2023-04-08: 10 mg via INTRAVENOUS
  Administered 2023-04-08: 60 mg via INTRAVENOUS
  Administered 2023-04-08: 10 mg via INTRAVENOUS

## 2023-04-08 MED ORDER — SENNOSIDES-DOCUSATE SODIUM 8.6-50 MG PO TABS
2.0000 | ORAL_TABLET | Freq: Every day | ORAL | Status: DC
Start: 2023-04-08 — End: 2023-04-09
  Administered 2023-04-08: 2 via ORAL
  Filled 2023-04-08: qty 2

## 2023-04-08 MED ORDER — BUPIVACAINE HCL 0.25 % IJ SOLN
INTRAMUSCULAR | Status: DC | PRN
  Administered 2023-04-08: 31 mL

## 2023-04-08 MED ORDER — PROPOFOL 1000 MG/100ML IV EMUL
INTRAVENOUS | Status: AC
Start: 2023-04-08 — End: ?
  Filled 2023-04-08: qty 100

## 2023-04-08 MED ORDER — ONDANSETRON HCL 4 MG/2ML IJ SOLN
INTRAMUSCULAR | Status: AC
  Filled 2023-04-08: qty 2

## 2023-04-08 MED ORDER — PROPOFOL 500 MG/50ML IV EMUL
INTRAVENOUS | Status: DC | PRN
  Administered 2023-04-08: 75 ug/kg/min via INTRAVENOUS

## 2023-04-08 MED ORDER — PROPOFOL 1000 MG/100ML IV EMUL
INTRAVENOUS | Status: AC
  Filled 2023-04-08: qty 100

## 2023-04-08 MED ORDER — CHLORHEXIDINE GLUCONATE 0.12 % MT SOLN
15.0000 mL | Freq: Once | OROMUCOSAL | Status: DC
Start: 2023-04-08 — End: 2023-04-08

## 2023-04-08 MED ORDER — ACETAMINOPHEN 500 MG PO TABS: 1000.0000 mg | ORAL_TABLET | Freq: Once | ORAL | Status: DC

## 2023-04-08 MED ORDER — LIDOCAINE HCL (PF) 2 % IJ SOLN: INTRAMUSCULAR | Status: DC | PRN

## 2023-04-08 MED ORDER — SODIUM CHLORIDE 0.9% FLUSH
10.0000 mL | Freq: Two times a day (BID) | INTRAVENOUS | Status: DC
  Administered 2023-04-08 – 2023-04-09 (×2): 10 mL via INTRAVENOUS

## 2023-04-08 MED ORDER — KETAMINE HCL 50 MG/5ML IJ SOSY
PREFILLED_SYRINGE | INTRAMUSCULAR | Status: AC
Start: 2023-04-08 — End: ?
  Filled 2023-04-08: qty 5

## 2023-04-08 MED ORDER — ACETAMINOPHEN 500 MG PO TABS
1000.0000 mg | ORAL_TABLET | Freq: Two times a day (BID) | ORAL | Status: DC
  Administered 2023-04-09: 1000 mg via ORAL
  Filled 2023-04-08: qty 2

## 2023-04-08 MED ORDER — PROPOFOL 10 MG/ML IV BOLUS
INTRAVENOUS | Status: DC | PRN
  Administered 2023-04-08: 200 mg via INTRAVENOUS

## 2023-04-08 MED ORDER — DICLOFENAC SODIUM 25 MG PO TBEC
50.0000 mg | DELAYED_RELEASE_TABLET | Freq: Three times a day (TID) | ORAL | Status: DC
  Administered 2023-04-08 – 2023-04-09 (×2): 50 mg via ORAL
  Filled 2023-04-08 (×4): qty 2

## 2023-04-08 MED ORDER — PROPOFOL 10 MG/ML IV BOLUS
INTRAVENOUS | Status: AC
Start: 2023-04-08 — End: ?
  Filled 2023-04-08: qty 20

## 2023-04-08 MED ORDER — FENTANYL CITRATE PF 50 MCG/ML IJ SOSY
25.0000 ug | PREFILLED_SYRINGE | INTRAMUSCULAR | Status: DC | PRN
  Administered 2023-04-08: 25 ug via INTRAVENOUS

## 2023-04-08 MED ORDER — STERILE WATER FOR IRRIGATION IR SOLN
Status: DC | PRN
  Administered 2023-04-08: 1000 mL

## 2023-04-08 MED ORDER — SUGAMMADEX SODIUM 200 MG/2ML IV SOLN
INTRAVENOUS | Status: DC | PRN
  Administered 2023-04-08: 300 mg via INTRAVENOUS

## 2023-04-08 MED ORDER — ONDANSETRON HCL 4 MG PO TABS
4.0000 mg | ORAL_TABLET | Freq: Four times a day (QID) | ORAL | Status: DC | PRN
Start: 2023-04-08 — End: 2023-04-09
  Administered 2023-04-08: 4 mg via ORAL
  Filled 2023-04-08: qty 1

## 2023-04-08 MED ORDER — DEXAMETHASONE SODIUM PHOSPHATE 10 MG/ML IJ SOLN
INTRAMUSCULAR | Status: AC
  Filled 2023-04-08: qty 1

## 2023-04-08 MED ORDER — STERILE WATER FOR INJECTION IJ SOLN
INTRAMUSCULAR | Status: DC | PRN
  Administered 2023-04-08: 5 mL via INTRAMUSCULAR

## 2023-04-08 MED ORDER — FENTANYL CITRATE PF 50 MCG/ML IJ SOSY
PREFILLED_SYRINGE | INTRAMUSCULAR | Status: AC
  Administered 2023-04-08: 25 ug via INTRAVENOUS
  Filled 2023-04-08: qty 1

## 2023-04-08 MED ORDER — FENTANYL CITRATE (PF) 250 MCG/5ML IJ SOLN
INTRAMUSCULAR | Status: AC
  Filled 2023-04-08: qty 5

## 2023-04-08 MED ORDER — LACTATED RINGERS IV SOLN: INTRAVENOUS | Status: DC

## 2023-04-08 MED ORDER — FENTANYL CITRATE (PF) 250 MCG/5ML IJ SOLN
INTRAMUSCULAR | Status: DC | PRN
  Administered 2023-04-08: 50 ug via INTRAVENOUS
  Administered 2023-04-08 (×2): 100 ug via INTRAVENOUS

## 2023-04-08 MED ORDER — AMISULPRIDE (ANTIEMETIC) 5 MG/2ML IV SOLN
INTRAVENOUS | Status: AC
  Filled 2023-04-08: qty 4

## 2023-04-08 MED ORDER — DEXAMETHASONE SODIUM PHOSPHATE 4 MG/ML IJ SOLN: 4.0000 mg | INTRAMUSCULAR | Status: DC

## 2023-04-08 MED ORDER — CEFAZOLIN IN SODIUM CHLORIDE 3-0.9 GM/100ML-% IV SOLN
3.0000 g | INTRAVENOUS | Status: AC
  Administered 2023-04-08: 3 g via INTRAVENOUS
  Filled 2023-04-08: qty 100

## 2023-04-08 MED ORDER — ORAL CARE MOUTH RINSE: 15.0000 mL | Freq: Once | OROMUCOSAL | Status: DC

## 2023-04-08 MED ORDER — KETAMINE HCL 10 MG/ML IJ SOLN
INTRAMUSCULAR | Status: DC | PRN
  Administered 2023-04-08: 10 mg via INTRAVENOUS
  Administered 2023-04-08: 20 mg via INTRAVENOUS

## 2023-04-08 MED ORDER — LACTATED RINGERS IR SOLN
Status: DC | PRN
  Administered 2023-04-08: 1000 mL

## 2023-04-08 MED ORDER — TRAMADOL HCL 50 MG PO TABS
100.0000 mg | ORAL_TABLET | Freq: Two times a day (BID) | ORAL | Status: DC | PRN
Start: 2023-04-08 — End: 2023-04-09
  Administered 2023-04-09: 100 mg via ORAL
  Filled 2023-04-08: qty 2

## 2023-04-08 MED ORDER — MIDAZOLAM HCL 2 MG/2ML IJ SOLN
INTRAMUSCULAR | Status: DC | PRN
  Administered 2023-04-08: 2 mg via INTRAVENOUS

## 2023-04-08 MED ORDER — MIDAZOLAM HCL 2 MG/2ML IJ SOLN
INTRAMUSCULAR | Status: AC
  Filled 2023-04-08: qty 2

## 2023-04-08 MED ORDER — AMISULPRIDE (ANTIEMETIC) 5 MG/2ML IV SOLN
10.0000 mg | Freq: Once | INTRAVENOUS | Status: AC | PRN
  Administered 2023-04-08: 10 mg via INTRAVENOUS

## 2023-04-08 MED ORDER — HEPARIN SODIUM (PORCINE) 5000 UNIT/ML IJ SOLN
5000.0000 [IU] | INTRAMUSCULAR | Status: AC
  Administered 2023-04-08: 5000 [IU] via SUBCUTANEOUS
  Filled 2023-04-08: qty 1

## 2023-04-08 SURGICAL SUPPLY — 73 items
APPLICATOR SURGIFLO ENDO (HEMOSTASIS) IMPLANT
BAG COUNTER SPONGE SURGICOUNT (BAG) IMPLANT
BAG LAPAROSCOPIC 12 15 PORT 16 (BASKET) IMPLANT
BAG RETRIEVAL 12/15 (BASKET)
BLADE SURG SZ10 CARB STEEL (BLADE) IMPLANT
COVER BACK TABLE 60X90IN (DRAPES) ×1 IMPLANT
COVER TIP SHEARS 8 DVNC (MISCELLANEOUS) ×1 IMPLANT
DERMABOND ADVANCED .7 DNX12 (GAUZE/BANDAGES/DRESSINGS) ×1 IMPLANT
DRAPE ARM DVNC X/XI (DISPOSABLE) ×4 IMPLANT
DRAPE COLUMN DVNC XI (DISPOSABLE) ×1 IMPLANT
DRAPE SHEET LG 3/4 BI-LAMINATE (DRAPES) ×1 IMPLANT
DRAPE SURG IRRIG POUCH 19X23 (DRAPES) ×1 IMPLANT
DRIVER NDL MEGA SUTCUT DVNCXI (INSTRUMENTS) ×1 IMPLANT
DRIVER NDLE MEGA SUTCUT DVNCXI (INSTRUMENTS) ×1 IMPLANT
DRSG OPSITE POSTOP 4X6 (GAUZE/BANDAGES/DRESSINGS) IMPLANT
DRSG OPSITE POSTOP 4X8 (GAUZE/BANDAGES/DRESSINGS) IMPLANT
ELECT PENCIL ROCKER SW 15FT (MISCELLANEOUS) IMPLANT
ELECT REM PT RETURN 15FT ADLT (MISCELLANEOUS) ×1 IMPLANT
FORCEPS BPLR FENES DVNC XI (FORCEP) ×1 IMPLANT
FORCEPS PROGRASP DVNC XI (FORCEP) ×1 IMPLANT
GAUZE 4X4 16PLY ~~LOC~~+RFID DBL (SPONGE) ×2 IMPLANT
GLOVE BIO SURGEON STRL SZ 6 (GLOVE) ×4 IMPLANT
GLOVE BIO SURGEON STRL SZ 6.5 (GLOVE) ×1 IMPLANT
GOWN STRL REUS W/ TWL LRG LVL3 (GOWN DISPOSABLE) ×4 IMPLANT
GRASPER SUT TROCAR 14GX15 (MISCELLANEOUS) IMPLANT
HOLDER FOLEY CATH W/STRAP (MISCELLANEOUS) IMPLANT
IRRIG SUCT STRYKERFLOW 2 WTIP (MISCELLANEOUS) ×1
IRRIGATION SUCT STRKRFLW 2 WTP (MISCELLANEOUS) ×1 IMPLANT
KIT PROCEDURE DVNC SI (MISCELLANEOUS) IMPLANT
KIT TURNOVER KIT A (KITS) IMPLANT
LIGASURE IMPACT 36 18CM CVD LR (INSTRUMENTS) IMPLANT
MANIPULATOR ADVINCU DEL 3.0 PL (MISCELLANEOUS) IMPLANT
MANIPULATOR ADVINCU DEL 3.5 PL (MISCELLANEOUS) IMPLANT
MANIPULATOR UTERINE 4.5 ZUMI (MISCELLANEOUS) IMPLANT
NDL HYPO 21X1.5 SAFETY (NEEDLE) ×1 IMPLANT
NDL SPNL 18GX3.5 QUINCKE PK (NEEDLE) IMPLANT
NEEDLE HYPO 21X1.5 SAFETY (NEEDLE) ×1 IMPLANT
NEEDLE SPNL 18GX3.5 QUINCKE PK (NEEDLE) ×1 IMPLANT
OBTURATOR OPTICAL STND 8 DVNC (TROCAR) ×1
OBTURATOR OPTICALSTD 8 DVNC (TROCAR) ×1 IMPLANT
PACK ROBOT GYN CUSTOM WL (TRAY / TRAY PROCEDURE) ×1 IMPLANT
PACKING VAGINAL (PACKING) IMPLANT
PAD POSITIONING PINK XL (MISCELLANEOUS) ×1 IMPLANT
PORT ACCESS TROCAR AIRSEAL 12 (TROCAR) IMPLANT
SCISSORS MNPLR CVD DVNC XI (INSTRUMENTS) ×1 IMPLANT
SCRUB CHG 4% DYNA-HEX 4OZ (MISCELLANEOUS) IMPLANT
SEAL UNIV 5-12 XI (MISCELLANEOUS) ×4 IMPLANT
SET TRI-LUMEN FLTR TB AIRSEAL (TUBING) ×1 IMPLANT
SPIKE FLUID TRANSFER (MISCELLANEOUS) ×1 IMPLANT
SPONGE T-LAP 18X18 ~~LOC~~+RFID (SPONGE) IMPLANT
SURGIFLO W/THROMBIN 8M KIT (HEMOSTASIS) IMPLANT
SUT MNCRL AB 4-0 PS2 18 (SUTURE) IMPLANT
SUT PDS AB 1 TP1 96 (SUTURE) IMPLANT
SUT STRATAFIX PDS+0 CT1 9 (SUTURE) IMPLANT
SUT V-LOC 180 0-0 GS22 (SUTURE) IMPLANT
SUT VIC AB 0 CT1 27XBRD ANTBC (SUTURE) IMPLANT
SUT VIC AB 2-0 CT1 TAPERPNT 27 (SUTURE) IMPLANT
SUT VIC AB 2-0 SH 27X BRD (SUTURE) IMPLANT
SUT VIC AB 4-0 PS2 18 (SUTURE) ×2 IMPLANT
SUT VICRYL 0 27 CT2 27 ABS (SUTURE) ×1 IMPLANT
SUT VLOC 180 0 9IN GS21 (SUTURE) IMPLANT
SYR 10ML LL (SYRINGE) IMPLANT
SYS BAG RETRIEVAL 10MM (BASKET)
SYS WOUND ALEXIS 18CM MED (MISCELLANEOUS)
SYSTEM BAG RETRIEVAL 10MM (BASKET) IMPLANT
SYSTEM WOUND ALEXIS 18CM MED (MISCELLANEOUS) IMPLANT
TOWEL OR NON WOVEN STRL DISP B (DISPOSABLE) IMPLANT
TRAP SPECIMEN MUCUS 40CC (MISCELLANEOUS) IMPLANT
TRAY FOLEY MTR SLVR 16FR STAT (SET/KITS/TRAYS/PACK) ×1 IMPLANT
TROCAR PORT AIRSEAL 5X120 (TROCAR) IMPLANT
UNDERPAD 30X36 HEAVY ABSORB (UNDERPADS AND DIAPERS) ×2 IMPLANT
WATER STERILE IRR 1000ML POUR (IV SOLUTION) ×1 IMPLANT
YANKAUER SUCT BULB TIP 10FT TU (MISCELLANEOUS) IMPLANT

## 2023-04-08 NOTE — Anesthesia Preprocedure Evaluation (Addendum)
Anesthesia Evaluation  Patient identified by MRN, date of birth, ID band Patient awake    Reviewed: Allergy & Precautions, NPO status , Patient's Chart, lab work & pertinent test results  Airway Mallampati: II  TM Distance: >3 FB Neck ROM: Full    Dental no notable dental hx.    Pulmonary neg pulmonary ROS   Pulmonary exam normal        Cardiovascular negative cardio ROS  Rhythm:Regular Rate:Normal     Neuro/Psych negative neurological ROS  negative psych ROS   GI/Hepatic negative GI ROS, Neg liver ROS,,,  Endo/Other    Class 3 obesity  Renal/GU negative Renal ROS  Female GU complaint Endometrial Ca    Musculoskeletal negative musculoskeletal ROS (+)    Abdominal Normal abdominal exam  (+)   Peds  Hematology   Anesthesia Other Findings ETT Placement Date: 03/16/23; Placement Time: 1103; Pre O2/Mask induction: Preoxygenated with 100% O2 by face mask; Mask ventilation: easy; Size: 7; Secured at: 21 cm; ETT Type: Oral; Cuffed: Cuffed; Insertion attempts: 1; View: II; Blade: (no with Miller 3 per me or Dr. Junious Dresser); Type of view: Glidescope; Intubation  Reproductive/Obstetrics                             Anesthesia Physical Anesthesia Plan  ASA: 3  Anesthesia Plan: General   Post-op Pain Management: Celebrex PO (pre-op)* and Tylenol PO (pre-op)*   Induction: Intravenous  PONV Risk Score and Plan: 3 and Ondansetron, Dexamethasone and Treatment may vary due to age or medical condition  Airway Management Planned: Mask and Oral ETT  Additional Equipment: None  Intra-op Plan:   Post-operative Plan: Extubation in OR  Informed Consent: I have reviewed the patients History and Physical, chart, labs and discussed the procedure including the risks, benefits and alternatives for the proposed anesthesia with the patient or authorized representative who has indicated his/her understanding and  acceptance.     Dental advisory given  Plan Discussed with: CRNA  Anesthesia Plan Comments:        Anesthesia Quick Evaluation

## 2023-04-08 NOTE — Discharge Instructions (Signed)
AFTER SURGERY INSTRUCTIONS   Return to work: 4-6 weeks if applicable   Activity: 1. Be up and out of the bed during the day.  Take a nap if needed.  You may walk up steps but be careful and use the hand rail.  Stair climbing will tire you more than you think, you may need to stop part way and rest.    2. No lifting or straining for 6 weeks over 10 pounds. No pushing, pulling, straining for 6 weeks.   3. No driving for around 0-10 days when the following criteria have been met.  Do not drive if you are taking narcotic pain medicine and make sure that your reaction time has returned.    4. You can shower as soon as the next day after surgery. Shower daily.  Use your regular soap and water (not directly on the incision) and pat your incision(s) dry afterwards; don't rub.  No tub baths or submerging your body in water until cleared by your surgeon. If you have the soap that was given to you by pre-surgical testing that was used before surgery, you do not need to use it afterwards because this can irritate your incisions.    5. No sexual activity and nothing in the vagina for 10-12 weeks.   6. You may experience a small amount of clear drainage from your incisions, which is normal.  If the drainage persists, increases, or changes color please call the office.   7. Do not use creams, lotions, or ointments such as neosporin on your incisions after surgery until advised by your surgeon because they can cause removal of the dermabond glue on your incisions.     8. You may experience vaginal spotting after surgery or when the stitches at the top of the vagina begin to dissolve.  The spotting is normal but if you experience heavy bleeding, call our office.   9. Take Tylenol or ibuprofen first for pain if you are able to take these medications and only use narcotic pain medication for severe pain not relieved by the Tylenol or Ibuprofen.  Monitor your Tylenol intake to a max of 4,000 mg in a 24 hour period.  You can alternate these medications after surgery.   Diet: 1. Low sodium Heart Healthy Diet is recommended but you are cleared to resume your normal (before surgery) diet after your procedure.   2. It is safe to use a laxative, such as Miralax or Colace, if you have difficulty moving your bowels. You have been prescribed Sennakot-S to take at bedtime every evening after surgery to keep bowel movements regular and to prevent constipation.     Wound Care: 1. Keep clean and dry.  Shower daily.   Reasons to call the Doctor: Fever - Oral temperature greater than 100.4 degrees Fahrenheit Foul-smelling vaginal discharge Difficulty urinating Nausea and vomiting Increased pain at the site of the incision that is unrelieved with pain medicine. Difficulty breathing with or without chest pain New calf pain especially if only on one side Sudden, continuing increased vaginal bleeding with or without clots.   Contacts: For questions or concerns you should contact:   Dr. Eugene Garnet at [phone]   Warner Mccreedy, NP at [phone]   After Hours: call [phone] and have the GYN Oncologist paged/contacted (after 5 pm or on the weekends). You will speak with an after hours RN and let he or she know you have had surgery.   Messages sent via mychart are for non-urgent  matters and are not responded to after hours so for urgent needs, please call the after hours number.

## 2023-04-08 NOTE — Interval H&P Note (Signed)
History and Physical Interval Note:  04/08/2023 2:19 PM  Carolyn Bradley  has presented today for surgery, with the diagnosis of ENDOMETRIAL CANCER.  The various methods of treatment have been discussed with the patient and family. After consideration of risks, benefits and other options for treatment, the patient has consented to  Procedure(s): XI ROBOTIC ASSISTED TOTAL HYSTERECTOMY WITH BILATERAL SALPINGO OOPHORECTOMY WITH OMENTECTOMY; POSSIBLE LAPAROTOMY (N/A) SENTINEL NODE BIOPSY (N/A) POSSIBLE LYMPH NODE DISSECTION (N/A) as a surgical intervention.  The patient's history has been reviewed, patient examined, no change in status, stable for surgery.  I have reviewed the patient's chart and labs.  Questions were answered to the patient's satisfaction.     Carver Fila

## 2023-04-08 NOTE — Op Note (Signed)
OPERATIVE NOTE  Pre-operative Diagnosis: endometrial cancer, high grade  Post-operative Diagnosis: same, fibroid uterus  Operation: Robotic-assisted laparoscopic total hysterectomy with bilateral salpingoophorectomy, SLN biopsy   Surgeon: Eugene Garnet MD  Assistant Surgeon: Antionette Char MD (an MD assistant was necessary for tissue manipulation, management of robotic instrumentation, retraction and positioning due to the complexity of the case and hospital policies).   Anesthesia: GET  Urine Output: 300 cc  Operative Findings:  : On EUA, large, minimally mobile uterus.  Intra-abdominal entry, normal upper abdominal survey.  Cecum and omentum adherent to the right upper and lateral abdominal wall.  Normal-appearing omentum, small bowel and colon.  Uterus approximately 12-14 cm and very bulbous with very limited mobility initially until adhesion discovered between the posterior uterus and the deep right pelvic sidewall.  Uterus itself appeared to have a 4-5 cm intramural fibroid.  Otherwise the surface was normal in appearance.  Normal-appearing bilateral adnexa.  No ascites.  No adenopathy.  Mapping successful to right internal iliac sentinel lymph node and 2 left external iliac sentinel lymph node.  No obvious peritoneal evidence of metastatic disease. On cystoscopy, bladder dome intact, good efflux noted from bilateral ureteral orifices.  Estimated Blood Loss:  150 cc      Total IV Fluids: see I&O flowsheet         Specimens: uterus, cervix, bilateral tubes and ovaries, left external iliac SLN, right obturator SLN, omentum, pelvic washings         Complications:  None apparent; patient tolerated the procedure well.         Disposition: PACU - hemodynamically stable.  Procedure Details  The patient was seen in the Holding Room. The risks, benefits, complications, treatment options, and expected outcomes were discussed with the patient.  The patient concurred with the proposed  plan, giving informed consent.  The site of surgery properly noted/marked. The patient was identified as Carolyn Bradley and the procedure verified as a Robotic-assisted hysterectomy with bilateral salpingo oophorectomy with SLN biopsy.   After induction of anesthesia, the patient was draped and prepped in the usual sterile manner. Patient was placed in supine position after anesthesia and draped and prepped in the usual sterile manner as follows: Her arms were tucked to her side with all appropriate precautions.  The patient was secured to the bed using padding and tape across her chest.  The patient was placed in the semi-lithotomy position in Sloan stirrups.  The perineum and vagina were prepped with CHG. The patient's abdomen was prepped with ChloraPrep and she was draped after the prep had been allowed to dry for 3 minutes.  A Time Out was held and the above information confirmed.  The urethra was prepped with Betadine. Foley catheter was placed.  A sterile speculum was placed in the vagina.  The cervix was grasped with a single-tooth tenaculum. 2mg  total of ICG was injected into the cervical stroma at 2 and 9 o'clock with 1cc injected at a 1cm and 2mm depth (concentration 0.5mg /ml) in all locations. The cervix was dilated with Shawnie Pons dilators.  The ZUMI uterine manipulator with a large colpotomizer ring was placed without difficulty.  A pneum occluder balloon was placed over the manipulator.  OG tube placement was confirmed and to suction.   Next, a 10 mm skin incision was made 1 cm below the subcostal margin in the midclavicular line.  The 5 mm Optiview port and scope was used for direct entry.  Opening pressure was under 10 mm CO2.  The abdomen was insufflated and the findings were noted as above.   At this point and all points during the procedure, the patient's intra-abdominal pressure did not exceed 15 mmHg. Next, an 8 mm skin incision was made superior to the umbilicus and a right and left port were  placed about 8 cm lateral to the robot port on the right and left side.  A fourth arm was placed on the left.  The 5 mm assist trocar was exchanged for a 10-12 mm port. All ports were placed under direct visualization.  The patient was placed in steep Trendelenburg.  Bowel was folded away into the upper abdomen.  The robot was docked in the normal manner.  The right and left peritoneum were opened parallel to the IP ligament to open the retroperitoneal spaces bilaterally. The round ligaments were transected. The SLN mapping was performed in bilateral pelvic basins. After identifying the ureters, the para rectal and paravesical spaces were opened up entirely with careful dissection below the level of the ureters bilaterally and to the depth of the uterine artery origin in order to skeletonize the uterine "web" and ensure visualization of all parametrial channels. The para-aortic basins were carefully exposed and evaluated for isolated para-aortic SLN's. Lymphatic channels were identified travelling to the following visualized sentinel lymph node's: right internal iliac and left external iliac SLNs. These SLN's were separated from their surrounding lymphatic tissue, removed and sent for permanent pathology.  The hysterectomy was started.  The ureter was again noted to be on the medial leaf of the broad ligament.  The peritoneum above the ureter was incised and stretched and the infundibulopelvic ligament was skeletonized, cauterized and cut.  The anterior peritoneum was taken down.  The bladder flap was created to the level of the KOH ring.  During the creation of the bladder flap, there was a less than 1 cm defect made in the bladder peritoneum.  This was oversewn with a figure-of-eight using 2-0 Vicryl.  An adhesion was noted posteriorly from the deep right pelvic sidewall to the posterior uterus.  Once this was lysed sharply, there was much improved mobility of the uterus.  The posterior peritoneum was taken  down to the level of the KOH ring. The uterine artery on the right side was skeletonized, cauterized and cut in the normal manner.  A similar procedure was performed on the left.  The colpotomy was made and the uterus, cervix, bilateral ovaries and tubes were amputated, laced in an Endo Catch bag and delivered through the vagina.  Pedicles were inspected and excellent hemostasis was achieved.    An infracolic omentectomy was performed after finding the transverse colon.  This was done with a combination of monopolar electrocautery and the vessel sealer.  Omentum was placed in an Endo Catch bag and taken out through the vagina.  The colpotomy at the vaginal cuff was closed with 0 Vicryl with a figure of eight at each apex and 0 Stratafix to close the midportion of the cuff in two layers in a running manner.  Irrigation was used and excellent hemostasis was achieved.  At this point in the procedure was completed.  Robotic instruments were removed under direct visulaization.  The robot was undocked.   The bladder was backfilled with 200 cc of sterile fluid and cystoscopy performed with findings noted above.  Foley catheter was then replaced.  The fascia at the 12 mm port was closed with 0 Vicryl using a PMI fascial closure device under direct  visualization.  The subcuticular tissue was closed with 4-0 Vicryl and the skin was closed with 4-0 Monocryl in a subcuticular manner.  Dermabond was applied.    Attention was then turned to the vagina where multiple lacerations including a long posterior laceration from the mid vagina to the distal vagina as well as a periurethral laceration bilaterally were noted.  These were all repaired with 2-0 Vicryl in running locked fashion.  Good hemostasis was noted after.  Vagina was irrigated.  Moistened vaginal packing was then placed to consolidate hemostasis overnight.  All sponge, lap and needle counts were correct x  3.   The patient was transferred to the recovery  room in stable condition.  Eugene Garnet, MD

## 2023-04-08 NOTE — Transfer of Care (Signed)
Immediate Anesthesia Transfer of Care Note  Patient: Carolyn Bradley  Procedure(s) Performed: XI ROBOTIC ASSISTED TOTAL HYSTERECTOMY WITH BILATERAL SALPINGO OOPHORECTOMY WITH OMENTECTOMY; CYSTOSCOPY SENTINEL NODE BIOPSY  Patient Location: PACU  Anesthesia Type:General  Level of Consciousness: sedated  Airway & Oxygen Therapy: Patient Spontanous Breathing and Patient connected to face mask oxygen  Post-op Assessment: Report given to RN and Post -op Vital signs reviewed and stable  Post vital signs: Reviewed and stable  Last Vitals:  Vitals Value Taken Time  BP 135/76 04/08/23 1902  Temp    Pulse 79 04/08/23 1905  Resp 17 04/08/23 1905  SpO2 100 % 04/08/23 1905  Vitals shown include unfiled device data.  Last Pain:  Vitals:   04/08/23 1333  TempSrc:   PainSc: 0-No pain         Complications: No notable events documented.

## 2023-04-08 NOTE — Anesthesia Procedure Notes (Signed)
Procedure Name: Intubation Date/Time: 04/08/2023 3:48 PM  Performed by: Maurene Capes, CRNAPre-anesthesia Checklist: Patient identified, Emergency Drugs available, Suction available and Patient being monitored Patient Re-evaluated:Patient Re-evaluated prior to induction Oxygen Delivery Method: Circle System Utilized Preoxygenation: Pre-oxygenation with 100% oxygen Induction Type: IV induction Ventilation: Mask ventilation without difficulty Laryngoscope Size: Glidescope and 4 Tube type: Oral Tube size: 7.0 mm Number of attempts: 1 Airway Equipment and Method: Rigid stylet Placement Confirmation: ETT inserted through vocal cords under direct vision, positive ETCO2 and breath sounds checked- equal and bilateral Secured at: 21 cm Tube secured with: Tape Dental Injury: Teeth and Oropharynx as per pre-operative assessment

## 2023-04-09 ENCOUNTER — Encounter (HOSPITAL_COMMUNITY): Payer: Self-pay | Admitting: Gynecologic Oncology

## 2023-04-09 DIAGNOSIS — C541 Malignant neoplasm of endometrium: Secondary | ICD-10-CM | POA: Diagnosis not present

## 2023-04-09 DIAGNOSIS — Z90710 Acquired absence of both cervix and uterus: Secondary | ICD-10-CM | POA: Diagnosis present

## 2023-04-09 LAB — BASIC METABOLIC PANEL
Anion gap: 9 (ref 5–15)
BUN: 9 mg/dL (ref 8–23)
CO2: 21 mmol/L — ABNORMAL LOW (ref 22–32)
Calcium: 9.2 mg/dL (ref 8.9–10.3)
Chloride: 107 mmol/L (ref 98–111)
Creatinine, Ser: 0.71 mg/dL (ref 0.44–1.00)
GFR, Estimated: >60 mL/min (ref >60)
Glucose, Bld: 128 mg/dL — ABNORMAL HIGH (ref 70–99)
Potassium: 4.3 mmol/L (ref 3.5–5.1)
Sodium: 137 mmol/L (ref 135–145)

## 2023-04-09 LAB — CBC
HCT: 30.5 % — ABNORMAL LOW (ref 36.0–46.0)
Hemoglobin: 9.4 g/dL — ABNORMAL LOW (ref 12.0–15.0)
MCH: 24.3 pg — ABNORMAL LOW (ref 26.0–34.0)
MCHC: 30.8 g/dL (ref 30.0–36.0)
MCV: 78.8 fL — ABNORMAL LOW (ref 80.0–100.0)
Platelets: 259 10*3/uL (ref 150–400)
RBC: 3.87 MIL/uL (ref 3.87–5.11)
RDW: 19.9 % — ABNORMAL HIGH (ref 11.5–15.5)
WBC: 11.3 10*3/uL — ABNORMAL HIGH (ref 4.0–10.5)
nRBC: 0 % (ref 0.0–0.2)

## 2023-04-09 MED ORDER — SENNOSIDES-DOCUSATE SODIUM 8.6-50 MG PO TABS: 2.0000 | ORAL_TABLET | Freq: Every day | ORAL | 0 refills | Status: DC

## 2023-04-09 MED ORDER — TRAMADOL HCL 50 MG PO TABS: 50.0000 mg | ORAL_TABLET | Freq: Four times a day (QID) | ORAL | 0 refills | Status: DC | PRN

## 2023-04-09 NOTE — Plan of Care (Signed)
  Problem: Education: Goal: Knowledge of General Education information will improve Description: Including pain rating scale, medication(s)/side effects and non-pharmacologic comfort measures Outcome: Progressing   Problem: Health Behavior/Discharge Planning: Goal: Ability to manage health-related needs will improve Outcome: Progressing   Problem: Clinical Measurements: Goal: Ability to maintain clinical measurements within normal limits will improve Outcome: Progressing Goal: Will remain free from infection Outcome: Progressing Goal: Diagnostic test results will improve Outcome: Progressing Goal: Respiratory complications will improve Outcome: Progressing Goal: Cardiovascular complication will be avoided Outcome: Progressing   Problem: Activity: Goal: Risk for activity intolerance will decrease Outcome: Progressing   Problem: Nutrition: Goal: Adequate nutrition will be maintained Outcome: Progressing   Problem: Coping: Goal: Level of anxiety will decrease Outcome: Progressing   Problem: Elimination: Goal: Will not experience complications related to bowel motility Outcome: Progressing Goal: Will not experience complications related to urinary retention Outcome: Progressing   Problem: Pain Management: Goal: General experience of comfort will improve Outcome: Progressing   Problem: Safety: Goal: Ability to remain free from injury will improve Outcome: Progressing   Problem: Skin Integrity: Goal: Risk for impaired skin integrity will decrease Outcome: Progressing   Problem: Education: Goal: Knowledge of the prescribed therapeutic regimen will improve Outcome: Progressing Goal: Understanding of sexual limitations or changes related to disease process or condition will improve Outcome: Progressing Goal: Individualized Educational Video(s) Outcome: Progressing   Problem: Self-Concept: Goal: Communication of feelings regarding changes in body function or  appearance will improve Outcome: Progressing   Problem: Skin Integrity: Goal: Demonstration of wound healing without infection will improve Outcome: Progressing

## 2023-04-09 NOTE — Care Management CC44 (Signed)
Condition Code 44 Documentation Completed  Patient Details  Name: TAKAYLA LUEDERS MRN: [id number] Date of Birth: 01-20-1956   Condition Code 44 given:  Yes Patient signature on Condition Code 44 notice:  Yes Documentation of 2 MD's agreement:  Yes Code 44 added to claim:  Yes    Adrian Prows, RN 04/09/2023, 11:33 AM

## 2023-04-09 NOTE — TOC Initial Note (Signed)
Transition of Care [facility]) - Initial/Assessment Note    Patient Details  Name: Carolyn Bradley MRN: [id number] Date of Birth: 18-Jun-1955  Transition of Care [facility]) CM/SW Contact:    Carolyn Prows, RN Phone Number: 04/09/2023, 11:39 AM  Clinical Narrative:                 No PCP listed; spoke w/ pt in room; pt says she is lives at home; she plans to return at d/c; pt says she has transportation; she denies SDOH risks; she verified insurance; pt says she needs a new PCP; she would like a list of female providers in zip [phone]; pt says she does not have DME, HH services, or home oxygen; pt given list of [facility] PCPs in requested zip code; she was also notified that she can contact her insurance for list of in-network providers; pt will make appt w/ provider of choice; no TOC needs.  Expected Discharge Plan: Home/Self Care Barriers to Discharge: No Barriers Identified   Patient Goals and CMS Choice Patient states their goals for this hospitalization and ongoing recovery are:: home CMS Medicare.gov Compare Post Acute Care list provided to:: Patient        Expected Discharge Plan and Services   Discharge Planning Services: CM Consult Post Acute Care Choice: NA Living arrangements for the past 2 months: Single Family Home Expected Discharge Date: 04/09/23               DME Arranged: N/A DME Agency: NA       HH Arranged: NA HH Agency: NA        Prior Living Arrangements/Services Living arrangements for the past 2 months: Single Family Home Lives with:: Self Patient language and need for interpreter reviewed:: Yes Do you feel safe going back to the place where you live?: Yes      Need for Family Participation in Patient Care: Yes (Comment) Care giver support system in place?: Yes (comment) Current home services:  (n/a) Criminal Activity/Legal Involvement Pertinent to Current Situation/Hospitalization: No - Comment as needed  Activities of Daily Living   ADL Screening  (condition at time of admission) Independently performs ADLs?: No Does the patient have a NEW difficulty with bathing/dressing/toileting/self-feeding that is expected to last >3 days?: Yes (Initiates electronic notice to provider for possible OT consult) Does the patient have a NEW difficulty with getting in/out of bed, walking, or climbing stairs that is expected to last >3 days?: Yes (Initiates electronic notice to provider for possible PT consult) Does the patient have a NEW difficulty with communication that is expected to last >3 days?: No Is the patient deaf or have difficulty hearing?: No Does the patient have difficulty seeing, even when wearing glasses/contacts?: No Does the patient have difficulty concentrating, remembering, or making decisions?: No  Permission Sought/Granted Permission sought to share information with : Case Manager Permission granted to share information with : Yes, Verbal Permission Granted  Share Information with NAME: Case Manager     Permission granted to share info w Relationship: [name] (son) [phone]     Emotional Assessment Appearance:: Appears stated age Attitude/Demeanor/Rapport: Gracious Affect (typically observed): Accepting Orientation: : Oriented to Self, Oriented to Place, Oriented to  Time, Oriented to Situation Alcohol / Substance Use: Not Applicable Psych Involvement: No (comment)  Admission diagnosis:  Endometrial ca [facility]) [C54.1] Status post laparoscopic hysterectomy [Z90.710] Patient Active Problem List   Diagnosis Date Noted   Status post laparoscopic hysterectomy 04/09/2023   Endometrial ca [facility])  04/08/2023   Endometrial cancer (HCC) 03/27/2023   PCP:  Patient, No Pcp Per Pharmacy:   [id number] - [place], [place] - [address] [place] [address] Phone: [phone] Fax: [phone]     Social Determinants of Health (SDOH) Social  History: SDOH Screenings   Food Insecurity: No Food Insecurity (04/09/2023)  Housing: Patient Declined (04/09/2023)  Transportation Needs: No Transportation Needs (04/09/2023)  Utilities: Not At Risk (04/09/2023)  Depression (PHQ2-9): Low Risk  (04/06/2023)  Tobacco Use: Unknown (04/08/2023)   SDOH Interventions: Food Insecurity Interventions: Intervention Not Indicated, Inpatient TOC Housing Interventions: Intervention Not Indicated, Inpatient TOC Transportation Interventions: Intervention Not Indicated, Inpatient TOC Utilities Interventions: Intervention Not Indicated, Inpatient TOC   Readmission Risk Interventions     No data to display

## 2023-04-09 NOTE — Progress Notes (Signed)
Reviewed written d/c orders with pt and all questions answered. Pt verbalized understanding. Pt left in stable condition with all belongings.

## 2023-04-09 NOTE — Discharge Summary (Signed)
Physician Discharge Summary  Patient ID: Carolyn Bradley MRN: [id number] DOB/AGE: Mar 23, 1956 67 y.o.  Admit date: 04/08/2023 Discharge date: 04/10/2023  Admission Diagnoses: Endometrial ca [facility])  Discharge Diagnoses:  Principal Problem:   Endometrial ca [facility]) Active Problems:   Status post laparoscopic hysterectomy   Discharged Condition:  The patient is in good condition and stable for discharge.   Hospital Course: On 04/08/2023, the patient underwent the following: Procedure(s): XI ROBOTIC ASSISTED TOTAL HYSTERECTOMY WITH BILATERAL SALPINGO OOPHORECTOMY WITH OMENTECTOMY; CYSTOSCOPY SENTINEL NODE BIOPSY.  The postoperative course was uneventful.  She was discharged to home on postoperative day 1 tolerating a regular diet, ambulating, voiding, pain controlled on oral medications, minimal vaginal bleeding after packing removal.   Consults: None  Significant Diagnostic Studies: Labs  Treatments: Surgery: see above  Discharge Exam (from am assessment): Blood pressure (!) 160/84, pulse 67, temperature 97.7 F (36.5 C), temperature source Oral, resp. rate 17, height 5\' 8"  (1.727 m), weight 264 lb 8.8 oz (120 kg), SpO2 100%. General: alert, cooperative, and no distress Resp: clear to auscultation bilaterally Cardio: regular rate and rhythm, S1, S2 normal, no murmur, click, rub or gallop GI: incision: lap sites to the abdomen with dermabond intact without drainage and abdomen soft, mildly hypo bowel sounds, non tender, non distended Extremities: extremities normal, atraumatic, no cyanosis or edema   Disposition: Discharge disposition: 01-Home or Self Care       Discharge Instructions     Call MD for:  difficulty breathing, headache or visual disturbances   Complete by: As directed    Call MD for:  extreme fatigue   Complete by: As directed    Call MD for:  hives   Complete by: As directed    Call MD for:  persistant dizziness or light-headedness   Complete by: As  directed    Call MD for:  persistant nausea and vomiting   Complete by: As directed    Call MD for:  redness, tenderness, or signs of infection (pain, swelling, redness, odor or green/yellow discharge around incision site)   Complete by: As directed    Call MD for:  severe uncontrolled pain   Complete by: As directed    Call MD for:  temperature >100.4   Complete by: As directed    Diet - low sodium heart healthy   Complete by: As directed    Driving Restrictions   Complete by: As directed    No driving for around 0-10 days when the following have been met: Do not take narcotics and drive. You need to make sure your reaction time has returned.   Increase activity slowly   Complete by: As directed    Lifting restrictions   Complete by: As directed    No lifting greater than 10 lbs, pushing, pulling, straining for 6 weeks.   Sexual Activity Restrictions   Complete by: As directed    No sexual activity, nothing in the vagina, for 10-12 weeks.      Allergies as of 04/09/2023       Reactions   Menthol Anaphylaxis        Medication List     TAKE these medications    senna-docusate 8.6-50 MG tablet Commonly known as: Senokot-S Take 2 tablets by mouth at bedtime. For AFTER surgery, do not take if having diarrhea   traMADol 50 MG tablet Commonly known as: ULTRAM Take 1 tablet (50 mg total) by mouth every 6 (six) hours as needed for severe pain (pain score  7-10). For AFTER surgery only, do not take and drive        Follow-up Information     Carver Fila, MD Follow up on 04/15/2023.   Specialty: Gynecologic Oncology Why: around 6:20pm or earlier in the day will be a PHONE visit with Dr. Pricilla Holm to check in and discuss pathology. IN PERSON visit will be on 05/08/2023 at 4 pm at the [facility] for your after surgery check up. Contact information: [address] [place] [address] [phone]                 Greater than thirty minutes were spend  for face to face discharge instructions and discharge orders/summary in EPIC.   Signed: Doylene Bode 04/10/2023, 7:22 AM

## 2023-04-09 NOTE — Progress Notes (Signed)
1 Day Post-Op Procedure(s) (LRB): XI ROBOTIC ASSISTED TOTAL HYSTERECTOMY WITH BILATERAL SALPINGO OOPHORECTOMY WITH OMENTECTOMY; CYSTOSCOPY (N/A) SENTINEL NODE BIOPSY (N/A)  Subjective: Patient reports belching intermittently which is normal for her. No flatus but typically does not pass large amounts of flatus. Pain is managed. No nausea. Tolerating diet. Ambulating without difficulty  Objective: Vital signs in last 24 hours: Temp:  [97.5 F (36.4 C)-98.2 F (36.8 C)] 98.1 F (36.7 C) (12/05 0540) Pulse Rate:  [78-91] 78 (12/05 0540) Resp:  [12-21] 17 (12/05 0540) BP: (135-183)/(76-94) 163/89 (12/05 0540) SpO2:  [96 %-100 %] 100 % (12/05 0540) Weight:  [264 lb 8.8 oz (120 kg)] 264 lb 8.8 oz (120 kg) (12/04 1333) Last BM Date : 04/07/23  Intake/Output from previous day: 12/04 0701 - 12/05 0700 In: 1880 [P.O.:880; I.V.:800; IV Piggyback:200] Out: 2400 [Urine:2250; Blood:150]  Physical Examination: General: alert, cooperative, and no distress Resp: clear to auscultation bilaterally Cardio: regular rate and rhythm, S1, S2 normal, no murmur, click, rub or gallop GI: incision: lap sites to the abdomen with dermabond intact without drainage and abdomen soft, mildly hypo bowel sounds, non tender, non distended Extremities: extremities normal, atraumatic, no cyanosis or edema SCDs on. Vaginal packing removed by Dr. Elouise Munroe right red blood on packing  Labs: WBC/Hgb/Hct/Plts:  11.3/9.4/30.5/259 (12/05 1610) BUN/Cr/glu/ALT/AST/amyl/lip:  9/0.71/--/--/--/--/-- (12/05 9604)  Assessment: 67 y.o. s/p Procedure(s): XI ROBOTIC ASSISTED TOTAL HYSTERECTOMY WITH BILATERAL SALPINGO OOPHORECTOMY WITH OMENTECTOMY; CYSTOSCOPY, SENTINEL NODE BIOPSY: stable Pain:  Pain is well-controlled on PRN medications.  Heme: Hgb 9.4 and Hct 30.5 this am. Appropriate given preop values and surgical losses.  ID: WBC 11.3-felt to be reactive. Given decadron and IV antibiotic intra-op.  CV: BP and HR stable.  Continue to monitor while inpatient.  GI:  Tolerating po: Yes. Antiemetics ordered prn.  GU: Foley in place. Creatinine 0.71 this am. Adequate output reported.     FEN: No critical values on am labs.  Prophylaxis: SCDs and lovenox ordered.  Plan: Vaginal packing removed. Pt to ambulate and increase mobility to see if there is any vaginal bleeding. If none in 2 hours, foley can be removed. Plan for possible discharge later today if minimal to no vaginal bleeding, voiding after foley removal, meeting post-op milestones. Continue plan of care   LOS: 1 day    Doylene Bode 04/09/2023, 6:53 AM

## 2023-04-09 NOTE — Care Management Obs Status (Signed)
MEDICARE OBSERVATION STATUS NOTIFICATION   Patient Details  Name: YUSRA CHAVERS MRN: [id number] Date of Birth: 04-Sep-1955   Medicare Observation Status Notification Given:  Yes    Adrian Prows, RN 04/09/2023, 11:32 AM

## 2023-04-09 NOTE — Progress Notes (Signed)
Mobility Specialist - Progress Note   04/09/23 0930  Mobility  Activity Ambulated independently in hallway;Ambulated independently to bathroom  Level of Assistance Independent  Assistive Device None  Distance Ambulated (ft) 500 ft  Activity Response Tolerated well  Mobility Referral Yes  $Mobility charge 1 Mobility  Mobility Specialist Start Time (ACUTE ONLY) [id number]  Mobility Specialist Stop Time (ACUTE ONLY) 0929  Mobility Specialist Time Calculation (min) (ACUTE ONLY) 31 min   Pt received in bed and agreeable to mobility. No complaints during session. Pt to bed after session with all needs met.    [facility]

## 2023-04-10 ENCOUNTER — Telehealth: Payer: Self-pay | Admitting: *Deleted

## 2023-04-10 LAB — CYTOLOGY - NON PAP

## 2023-04-10 NOTE — Telephone Encounter (Signed)
Spoke with Carolyn Bradley this morning. She states she is eating, drinking and urinating well. She has had a BM and is passing gas. She is taking senokot as prescribed and encouraged her to drink plenty of water. She denies fever or chills. Incisions are dry and intact, pt states some clear drainage to incisions.  She rates her pain 0/10. She is not taking anything for pain.   Instructed to call office with any fever, chills, purulent drainage, uncontrolled pain or any other questions or concerns. Patient verbalizes understanding.   Pt aware of post op appointments as well as the office number [phone] and after hours number [phone] to call if she has any questions or concerns

## 2023-04-10 NOTE — Anesthesia Postprocedure Evaluation (Signed)
Anesthesia Post Note  Patient: Carolyn Bradley  Procedure(s) Performed: XI ROBOTIC ASSISTED TOTAL HYSTERECTOMY WITH BILATERAL SALPINGO OOPHORECTOMY WITH OMENTECTOMY; CYSTOSCOPY SENTINEL NODE BIOPSY     Patient location during evaluation: PACU Anesthesia Type: General Level of consciousness: awake and alert Pain management: pain level controlled Vital Signs Assessment: post-procedure vital signs reviewed and stable Respiratory status: spontaneous breathing, nonlabored ventilation, respiratory function stable and patient connected to nasal cannula oxygen Cardiovascular status: blood pressure returned to baseline and stable Postop Assessment: no apparent nausea or vomiting Anesthetic complications: no   No notable events documented.  Last Vitals:  Vitals:   04/09/23 0952 04/09/23 1253  BP: (!) 153/89 (!) 160/84  Pulse: 89 67  Resp: 17 17  Temp: 36.8 C 36.5 C  SpO2: 99% 100%    Last Pain:  Vitals:   04/09/23 1253  TempSrc: Oral  PainSc:                  Kennieth Rad

## 2023-04-13 LAB — SURGICAL PATHOLOGY

## 2023-04-15 ENCOUNTER — Inpatient Hospital Stay (HOSPITAL_BASED_OUTPATIENT_CLINIC_OR_DEPARTMENT_OTHER): Payer: Medicare Other | Admitting: Gynecologic Oncology

## 2023-04-15 ENCOUNTER — Encounter: Payer: Self-pay | Admitting: Gynecologic Oncology

## 2023-04-15 DIAGNOSIS — C541 Malignant neoplasm of endometrium: Secondary | ICD-10-CM

## 2023-04-15 DIAGNOSIS — Z7189 Other specified counseling: Secondary | ICD-10-CM

## 2023-04-15 NOTE — Progress Notes (Signed)
Gynecologic Oncology Telehealth Note: Gyn-Onc  I connected with Carolyn Bradley on 04/15/23 at  6:20 PM EST by telephone and verified that I am speaking with the correct person using two identifiers.  I discussed the limitations, risks, security and privacy concerns of performing an evaluation and management service by telemedicine and the availability of in-person appointments. I also discussed with the patient that there may be a patient responsible charge related to this service. The patient expressed understanding and agreed to proceed.  Other persons participating in the visit and their role in the encounter: none.  Patient's location: home Provider's location: [facility]  Reason for Visit: follow-up  Treatment History: Oncology History Overview Note  The patient was initially seen on 02/05/2023 for postmenopausal bleeding.  This was described as heavy vaginal bleeding over 4 months.  At that time she denied ever having sensation of her menses but that current, heavier bleeding started in June 2024.  This was described as heavy bleeding with passage of clots initially, requiring the use of 5-6 overnight pads during the day and 1 change overnight.  dMMR P53 WT   Endometrial cancer (HCC)  02/18/2023 Imaging   Pelvic ultrasound exam: Uterus measures 7.7 x 6.5 x 7.2 cm.  Multiple calcified uterine fibroids noted, largest measuring up to 2.5 cm. Endometrium 3.8 cm, diffusely heterogenous. Right ovary normal in appearance.  Left ovary not visualized. No free fluid.   03/16/2023 Surgery   EUA, D&C for persistent PMB    03/16/2023 Pathology Results   Endometrial curettings: Mixed endometrioid and serous carcinoma.  Strong block positivity for p16.  P53 appears wild-type.   03/27/2023 Initial Diagnosis   Endometrial cancer (HCC)   04/03/2023 Imaging   CT C/A/P:  Retroverted uterus with abnormal thickening of the endometrium as described on ultrasound. No specific abnormal lymph node  enlargement identified. No parametrial soft tissues. Separate calcified uterine fibroids. No acute cardiopulmonary disease. No dominant lung nodules. No bowel obstruction. Few sigmoid colon diverticula. Benign-appearing left-sided renal cysts. Presumed left adrenal adenoma but not confirmed on this examination. Please correlate with any prior versus MRI in and out of phase evaluation or short follow up   04/08/2023 Surgery   Robotic-assisted laparoscopic total hysterectomy with bilateral salpingoophorectomy, SLN biopsy    04/08/2023 Pathology Results   A. LEFT EXTERNAL ILIAC SENTINEL LYMPH NODE, EXCISION:               One benign lymph node, negative for carcinoma (0/1)  B. RIGHT INTERNAL ILIAC SENTINEL LYMPH NODE, EXCISION:               One benign lymph node, negative for carcinoma (0/1)  C. 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 OMENTUM, OMENTECTOMY:              Benign omentum, negative for tumor  ONCOLOGY TABLE:  UTERUS, CARCINOMA OR CARCINOSARCOMA: Resection  Procedure: Total hysterectomy and bilateral salpingo-oophorectomy with sentinel nodes omentectomy Histologic Type: Endometrioid adenocarcinoma Histologic Grade: Moderate to poorly differentiated, FIGO 3 Myometrial Invasion:  Depth of Myometrial Invasion (mm): 22 mm      Myometrial Thickness (mm): 30 mm      Percentage of Myometrial Invasion:  73% Uterine Serosa Involvement: Not identified Cervical stromal Involvement: Not identified Extent of involvement of other tissue/organs: Not identified Peritoneal/Ascitic Fluid: Negative ([id number]) Lymphovascular Invasion: Not identified Regional Lymph Nodes:      Pelvic Lymph Nodes Examined: 2 sentinel lymph nodes      Pelvic Lymph Nodes with Metastasis: 0          Macrometastasis: (>2.0 mm): 0          Micrometastasis: (>0.2 mm and < 2.0 mm): 0          Isolated Tumor Cells (<0.2 mm): 0          Laterality of Lymph Node with Tumor: NA          Extracapsular Extension: NA Distant Metastasis: Not applicable Pathologic Stage Classification (pTNM, AJCC 8th Edition): pT1b, pN0 FIGO Stage (2023 staging for cancer of the endometrium): IIC FIGO Modified Classification: mMMRd Ancillary Studies: MMR testing shows MLH-1 and PMS2 loss Representative Tumor Block: C5, C7 Comment(s): A pancytokeratin immunohistochemical stains performed lymph nodes is negative with adequate control. (v4.2.0.1)  COMMENT:  Immunohistochemical stain for p53 was performed and is wild type pattern of staining.      Interval History: Doing well. Walking frequently. Bowels are moving well with sennakot. Denies vaginal bleeding. Denies urinary symptoms.  Past Medical/Surgical History: Past Medical History:  Diagnosis Date   Complication of anesthesia    needed small tube for intubation   Endometrial cancer (HCC)    Enlarged uterus    Genital pruritus    Obesity, Class III, BMI 40-49.9 (morbid obesity) (HCC)    Post-menopausal bleeding     Past Surgical History:  Procedure Laterality Date   DILATION AND CURETTAGE OF UTERUS  03/16/2023   [facility]   ROBOTIC ASSISTED TOTAL HYSTERECTOMY WITH BILATERAL SALPINGO OOPHERECTOMY N/A 04/08/2023   Procedure: XI ROBOTIC ASSISTED TOTAL HYSTERECTOMY WITH BILATERAL SALPINGO OOPHORECTOMY WITH OMENTECTOMY; CYSTOSCOPY;  Surgeon: Carver Fila, MD;  Location:  [facility];  Service: Gynecology;  Laterality: N/A;   SENTINEL NODE BIOPSY N/A 04/08/2023   Procedure: SENTINEL NODE BIOPSY;  Surgeon: Carver Fila, MD;  Location: [facility];  Service: Gynecology;  Laterality: N/A;    Family History  Problem Relation Age of Onset   Cancer Mother    Colon cancer Mother    Cancer Paternal Grandfather     Social History   Socioeconomic History   Marital status: Widowed    Spouse name: Not on file   Number of children: Not on file   Years of education: Not on file   Highest education level: Not on file  Occupational History   Not on file  Tobacco Use   Smoking status: Never   Smokeless tobacco: Not on file  Vaping Use   Vaping status: Never Used  Substance and Sexual Activity   Alcohol use: No   Drug use: No   Sexual activity: Not Currently  Other Topics Concern   Not on file  Social History Narrative   Not on file   Social Determinants of Health   Financial Resource Strain: Not on file  Food Insecurity: No Food Insecurity (04/09/2023)   Hunger Vital Sign    Worried About Running Out of Food in the Last Year: Never true    Ran Out of Food in the Last Year: Never true  Transportation  Needs: No Transportation Needs (04/09/2023)   PRAPARE - Administrator, Civil Service (Medical): No    Lack of Transportation (Non-Medical): No  Physical Activity: Not on file  Stress: Not on file  Social Connections: Not on file    Current Medications:  Current Outpatient Medications:    senna-docusate (SENOKOT-S) 8.6-50 MG tablet, Take 2 tablets by mouth at bedtime. For AFTER surgery, do not take if having diarrhea, Disp: 30 tablet, Rfl: 0   traMADol (ULTRAM) 50 MG tablet, Take 1 tablet (50 mg total) by mouth every 6 (six) hours as needed for severe pain (pain score 7-10). For AFTER surgery only, do not take and drive, Disp: 10 tablet, Rfl: 0  Review of Symptoms: Pertinent positives as per HPI.  Physical Exam: Deferred given limitations  of phone visit.  Laboratory & Radiologic Studies: None new  Assessment & Plan: Carolyn Bradley is a 67 y.o. 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  Patient is doing very well, meeting postoperative milestones.  Discussed continued expectations and restrictions.  Discussed pathology which shows grade 3 endometrioid adenocarcinoma confined to the uterus although with outer half myometrial invasion.  MMR IHC shows loss of MLH1 and PMS2.  Message sent to assure that mL H1 promoter hyper methylation and MSI testing are in process.  Also discussed genomic testing versus POLE mutation testing.  Discussed recommendation for adjuvant therapy given high risk features, likely radiation although would like to see additional test results before making final treatment plan.  Patient amenable to having consult with radiation oncology scheduled.  I discussed the assessment and treatment plan with the patient. The patient was provided with an opportunity to ask questions and all were answered. The patient agreed with the plan and demonstrated an understanding of the instructions.   The patient was advised to call back or see an in-person evaluation if the symptoms worsen or if the condition fails to improve as anticipated.   8 minutes of total time was spent for this patient encounter, including preparation, phone counseling with the patient and coordination of care, and documentation of the encounter.   Eugene Garnet, MD  Division of Gynecologic Oncology  Department of Obstetrics and Gynecology  [facility]

## 2023-04-16 ENCOUNTER — Encounter: Payer: Self-pay | Admitting: Oncology

## 2023-04-16 DIAGNOSIS — C541 Malignant neoplasm of endometrium: Secondary | ICD-10-CM

## 2023-04-16 NOTE — Addendum Note (Signed)
Addended by: Renaldo Reel R on: 04/16/2023 10:27 AM   Modules accepted: Orders

## 2023-04-16 NOTE — Progress Notes (Signed)
Referral placed to radiation oncology per Dr. Pricilla Holm.

## 2023-04-16 NOTE — Progress Notes (Signed)
Sent order requisition to [facility] on accession [id number] per Dr. Pricilla Holm.

## 2023-04-27 ENCOUNTER — Other Ambulatory Visit: Payer: Self-pay | Admitting: Oncology

## 2023-04-27 LAB — MOLECULAR PATHOLOGY

## 2023-04-27 NOTE — Progress Notes (Signed)
Gynecologic Oncology Multi-Disciplinary Disposition Conference Note  Date of the Conference: 04/27/2023  Patient Name: Carolyn Bradley  Referring Provider: Dr. Ralph Dowdy Primary GYN Oncologist: Dr. Pricilla Holm  Stage/Disposition:  Stage IIC, grade 3 endometrioid endometrial adenocarcinoma . Disposition is to external beam radiation and vaginal brachytherapy depending on P53 results on NGS testing.  If P53 is abnormal, consideration for chemotherapy with vaginal brachytherapy.   This Multidisciplinary conference took place involving physicians from Gynecologic Oncology, Medical Oncology, Radiation Oncology, Pathology, Radiology along with the Gynecologic Oncology Nurse Practitioner and Gynecologic Oncology Nurse Navigator.  Comprehensive assessment of the patient's malignancy, staging, need for surgery, chemotherapy, radiation therapy, and need for further testing were reviewed. Supportive measures, both inpatient and following discharge were also discussed. The recommended plan of care is documented. Greater than 35 minutes were spent correlating and coordinating this patient's care.

## 2023-05-04 ENCOUNTER — Telehealth: Payer: Self-pay | Admitting: Radiation Oncology

## 2023-05-04 NOTE — Telephone Encounter (Signed)
Called patient to schedule consult per 12/12 referral. Patient stated she did not want Radiation and declined services. Closed referral.

## 2023-05-08 ENCOUNTER — Inpatient Hospital Stay: Payer: Medicare Other | Attending: Gynecologic Oncology | Admitting: Gynecologic Oncology

## 2023-05-08 ENCOUNTER — Encounter: Payer: Self-pay | Admitting: Gynecologic Oncology

## 2023-05-08 ENCOUNTER — Encounter: Payer: Self-pay | Admitting: Oncology

## 2023-05-08 VITALS — BP 152/88 | HR 87 | Temp 98.4°F | Resp 19 | Wt 264.8 lb

## 2023-05-08 DIAGNOSIS — Z90710 Acquired absence of both cervix and uterus: Secondary | ICD-10-CM

## 2023-05-08 DIAGNOSIS — C541 Malignant neoplasm of endometrium: Secondary | ICD-10-CM

## 2023-05-08 DIAGNOSIS — Z7189 Other specified counseling: Secondary | ICD-10-CM

## 2023-05-08 DIAGNOSIS — Z90722 Acquired absence of ovaries, bilateral: Secondary | ICD-10-CM

## 2023-05-08 DIAGNOSIS — Z9079 Acquired absence of other genital organ(s): Secondary | ICD-10-CM

## 2023-05-08 DIAGNOSIS — L292 Pruritus vulvae: Secondary | ICD-10-CM

## 2023-05-08 MED ORDER — FLUCONAZOLE 150 MG PO TABS: 150.0000 mg | ORAL_TABLET | Freq: Every day | ORAL | 1 refills | Status: DC

## 2023-05-08 NOTE — Progress Notes (Signed)
 Gynecologic Oncology Return Clinic Visit  05/08/23  Reason for Visit: follow-up, treatment planning  Treatment History: Oncology History Overview Note  The patient was initially seen on 02/05/2023 for postmenopausal bleeding.  This was described as heavy vaginal bleeding over 4 months.  At that time she denied ever having sensation of her menses but that current, heavier bleeding started in June 2024.  This was described as heavy bleeding with passage of clots initially, requiring the use of 5-6 overnight pads during the day and 1 change overnight.  dMMR P53 WT   Endometrial cancer (HCC)  02/18/2023 Imaging   Pelvic ultrasound exam: Uterus measures 7.7 x 6.5 x 7.2 cm.  Multiple calcified uterine fibroids noted, largest measuring up to 2.5 cm. Endometrium 3.8 cm, diffusely heterogenous. Right ovary normal in appearance.  Left ovary not visualized. No free fluid.   03/16/2023 Surgery   EUA, D&C for persistent PMB    03/16/2023 Pathology Results   Endometrial curettings: Mixed endometrioid and serous carcinoma.  Strong block positivity for p16.  P53 appears wild-type.   03/27/2023 Initial Diagnosis   Endometrial cancer (HCC)   04/03/2023 Imaging   CT C/A/P:  Retroverted uterus with abnormal thickening of the endometrium as described on ultrasound. No specific abnormal lymph node enlargement identified. No parametrial soft tissues. Separate calcified uterine fibroids. No acute cardiopulmonary disease. No dominant lung nodules. No bowel obstruction. Few sigmoid colon diverticula. Benign-appearing left-sided renal cysts. Presumed left adrenal adenoma but not confirmed on this examination. Please correlate with any prior versus MRI in and out of phase evaluation or short follow up   04/08/2023 Surgery   Robotic-assisted laparoscopic total hysterectomy with bilateral salpingoophorectomy, SLN biopsy    04/08/2023 Pathology Results   A. LEFT EXTERNAL ILIAC SENTINEL LYMPH NODE,  EXCISION:               One benign lymph node, negative for carcinoma (0/1)  B. RIGHT INTERNAL ILIAC SENTINEL LYMPH NODE, EXCISION:               One benign lymph node, negative for carcinoma (0/1)  C. 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 OMENTUM, OMENTECTOMY:              Benign omentum, negative for tumor  ONCOLOGY TABLE:  UTERUS, CARCINOMA OR CARCINOSARCOMA: Resection  Procedure: Total hysterectomy and bilateral salpingo-oophorectomy with sentinel nodes omentectomy Histologic Type: Endometrioid adenocarcinoma Histologic Grade: Moderate to poorly differentiated, FIGO 3 Myometrial Invasion:      Depth of Myometrial Invasion (mm): 22 mm      Myometrial Thickness (mm): 30 mm      Percentage of Myometrial Invasion: 73% Uterine Serosa Involvement: Not identified Cervical stromal Involvement: Not identified Extent of involvement of other tissue/organs: Not identified Peritoneal/Ascitic Fluid: Negative ([id number]) Lymphovascular Invasion: Not identified Regional Lymph Nodes:      Pelvic Lymph Nodes Examined: 2 sentinel lymph nodes      Pelvic Lymph Nodes with Metastasis: 0          Macrometastasis: (>2.0 mm): 0  Micrometastasis: (>0.2 mm and < 2.0 mm): 0          Isolated Tumor Cells (<0.2 mm): 0          Laterality of Lymph Node with Tumor: NA          Extracapsular Extension: NA Distant Metastasis:  Not applicable Pathologic Stage Classification (pTNM, AJCC 8th Edition): pT1b, pN0 FIGO Stage (2023 staging for cancer of the endometrium): IIC FIGO Modified Classification: mMMRd Ancillary Studies: MMR testing shows MLH-1 and PMS2 loss Representative Tumor Block: C5, C7 Comment(s): A pancytokeratin immunohistochemical stains performed lymph nodes is negative with adequate control. (v4.2.0.1)  COMMENT:  Immunohistochemical stain for p53 was performed and is wild type pattern of staining.    04/08/2023 Pathology Results   CARIS  MMRd, MSI-H, TMB H (27 mut/Mb) ER(2+)/PR(2+) positive BRCA1 mutation ARID1A, PTEN mutations ERRB2 likely pathogenic variant (IHC 0) KRAS, BRAF, POLE, p53 no mutation     Interval History: Reports overall doing well.  Energy still low but improving.  Has some abdominal soreness, improving each day.  Notes intermittent light yellow discharge when she wipes.  Denies any vaginal bleeding.  Denies any bladder symptoms.  Reports good bowel function with the use of stool softener.  Has some vulvar/vaginal itching as well as itching around her right mid abdominal incision.  Past Medical/Surgical History: Past Medical History:  Diagnosis Date   Complication of anesthesia    needed small tube for intubation   Endometrial cancer (HCC)    Enlarged uterus    Genital pruritus    Obesity, Class III, BMI 40-49.9 (morbid obesity) (HCC)    Post-menopausal bleeding     Past Surgical History:  Procedure Laterality Date   DILATION AND CURETTAGE OF UTERUS  03/16/2023   [facility]   ROBOTIC ASSISTED TOTAL HYSTERECTOMY WITH BILATERAL SALPINGO OOPHERECTOMY N/A 04/08/2023   Procedure: XI ROBOTIC ASSISTED TOTAL HYSTERECTOMY WITH BILATERAL SALPINGO OOPHORECTOMY WITH OMENTECTOMY; CYSTOSCOPY;  Surgeon: Viktoria Comer SAUNDERS, MD;  Location: [facility];  Service: Gynecology;  Laterality: N/A;   SENTINEL NODE BIOPSY N/A 04/08/2023   Procedure: SENTINEL NODE BIOPSY;  Surgeon: Viktoria Comer SAUNDERS, MD;  Location: [facility];  Service: Gynecology;  Laterality: N/A;    Family History  Problem Relation Age of Onset   Cancer Mother    Colon cancer Mother    Cancer Paternal Grandfather     Social History   Socioeconomic History   Marital status: Widowed    Spouse name: Not on file   Number of children: Not on file   Years of education: Not on file   Highest education level: Not on file  Occupational History   Not on file  Tobacco Use   Smoking status: Never   Smokeless tobacco: Not on file  Vaping Use   Vaping status: Never Used  Substance and Sexual Activity   Alcohol use: No   Drug use: No   Sexual activity: Not Currently  Other Topics Concern   Not on file  Social History Narrative   Not on file   Social Drivers of Health   Financial Resource Strain: Not on file  Food Insecurity: No Food Insecurity (04/09/2023)   Hunger Vital Sign    Worried About Running Out of Food in the Last Year: Never true    Ran Out of Food in the Last Year: Never true  Transportation Needs: No Transportation Needs (04/09/2023)   PRAPARE - Administrator, Civil Service (Medical): No  Lack of Transportation (Non-Medical): No  Physical Activity: Not on file  Stress: Not on file  Social Connections: Not on file    Current Medications:  Current Outpatient Medications:    fluconazole  (DIFLUCAN ) 150 MG tablet, Take 1 tablet (150 mg total) by mouth daily., Disp: 1 tablet, Rfl: 1   senna-docusate (SENOKOT-S) 8.6-50 MG tablet, Take 2 tablets by mouth at bedtime. For AFTER surgery, do not take if having diarrhea, Disp: 30 tablet, Rfl: 0   traMADol  (ULTRAM ) 50 MG tablet, Take 1 tablet (50 mg total) by mouth every 6 (six) hours as needed for severe pain (pain score 7-10). For AFTER surgery only, do not take and drive, Disp: 10 tablet, Rfl: 0  Review of Systems: Denies appetite changes, fevers, chills, fatigue, unexplained weight changes. Denies hearing loss, neck lumps or  masses, mouth sores, ringing in ears or voice changes. Denies cough or wheezing.  Denies shortness of breath. Denies chest pain or palpitations. Denies leg swelling. Denies abdominal distention, pain, blood in stools, constipation, diarrhea, nausea, vomiting, or early satiety. Denies pain with intercourse, dysuria, frequency, hematuria or incontinence. Denies hot flashes, pelvic pain, vaginal bleeding.   Denies joint pain, back pain or muscle pain/cramps. Denies rash, or wounds. Denies dizziness, headaches, numbness or seizures. Denies swollen lymph nodes or glands, denies easy bruising or bleeding. Denies anxiety, depression, confusion, or decreased concentration.  Physical Exam: BP (!) 152/88 Comment: manual recheck, MD notified. Will monitor and follow up with PCP  Pulse 87   Temp 98.4 F (36.9 C) (Oral)   Resp 19   Wt 264 lb 12.8 oz (120.1 kg)   SpO2 100%   BMI 40.26 kg/m  General: Alert, oriented, no acute distress. HEENT: Normocephalic, atraumatic, sclera anicteric. Chest: Unlabored breathing on room air. Abdomen: Obese, soft, nontender.  Normoactive bowel sounds.  No masses or hepatosplenomegaly appreciated.  Well-healed incisions other than the right mid abdominal incision which has opened and is healing by secondary intent.  No surrounding erythema, induration, or exudate. Extremities: Grossly normal range of motion.  Warm, well perfused.  No edema bilaterally.  GU: Normal appearing external genitalia without erythema, excoriation, or lesions.  Exam overall poorly tolerated.  Speculum exam reveals vaginal suture in place, no blood within the vaginal vault, minimal physiologic discharge.  On gentle speculum exam, cuff is intact with suture visible.  Given poor toleration of bimanual exam, unable to palpate cuff.   Laboratory & Radiologic Studies: None new  Assessment & Plan: Carolyn Bradley is a 68 y.o. 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  MMRd, MLH1 promoter hypermethylation present. CARIS: MMRd, MSI-H. BRCA1, PTEN mutation.  ER(2+)/PR(2+) positive ERRB2 likely pathogenic variant (IHC 0) KRAS, BRAF, POLE, p53 no mutation  Patient is overall doing well, meeting postoperative milestones.  Discussed continued expectations and restrictions.  Given discharge as well as pruritus, discussed empiric treatment for Candida.  Prescription for Diflucan  sent to her pharmacy.  I have asked her to reach out if she does not notice relief of her itching symptoms.  We discussed pathology from surgery.  We reviewed in detail her pathology report and she was given a copy of this for her own records.  We also reviewed results from her Caris genomic testing.  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  There is no p53 or POLE mutation.  Discussed options for adjuvant treatment and my recommendation for therapy in the  setting of high risk although early stage disease.  Given p53 wild-type, I would favor adjuvant treatment with radiation.  The patient voices hesitancy about radiation but is willing to meet with our radiation oncologist for consultation.  She declines interest in consideration of chemotherapy.  She was scheduled for a 86-month follow-up visit with me for surveillance in the event that she opts not to pursue any adjuvant treatment.  22 minutes of total time was spent for this patient encounter, including preparation, face-to-face counseling with the patient and coordination of care, and documentation of the encounter.  Comer Dollar, MD  Division of Gynecologic Oncology  Department of Obstetrics and Gynecology  [facility]

## 2023-05-08 NOTE — Progress Notes (Signed)
New referral placed for radiation oncology.

## 2023-05-08 NOTE — Patient Instructions (Signed)
 It was good to see you today.  You are healing well from surgery.  Please remember, no heavy lifting for at least 6 weeks after surgery and nothing in the vagina for at least 12 weeks.  I am sending a prescription for a one-time dose of a medication to take for yeast.  Please let me know if itching does not improve.

## 2023-05-13 ENCOUNTER — Telehealth: Payer: Self-pay | Admitting: Oncology

## 2023-05-13 NOTE — Telephone Encounter (Signed)
 Called [place] and reviewed the recommendation for radiation with her.  She verbalized agreement and understanding.  Also reviewed her upcoming appointments on 05/20/23 for a nurse eval at 2:30 and consult with Dr. Roselind Messier at 3:00.

## 2023-05-20 ENCOUNTER — Ambulatory Visit
Admission: RE | Admit: 2023-05-20 | Discharge: 2023-05-20 | Disposition: A | Discharge status: Home / Self Care | Payer: Medicare Other | Source: Ambulatory Visit | Attending: Radiation Oncology | Admitting: Radiation Oncology

## 2023-05-20 ENCOUNTER — Ambulatory Visit: Payer: Medicare Other

## 2023-05-29 NOTE — Progress Notes (Incomplete)
GYN Location of Tumor / Histology: Endometrial  Carolyn Bradley presented with symptoms of: The patient was initially seen on 02/05/2023 for postmenopausal bleeding   Biopsies revealed:   Past/Anticipated interventions by Gyn/Onc surgery, if any:   Past/Anticipated interventions by medical oncology, if any:   Weight changes, if any: {:18581}  Bowel/Bladder complaints, if any: {yes no:[id number]}, {Blank single:[id number]::"diarrhea","constipation","urinary frequency","burning","trouble emptying bladder"," "}  Nausea/Vomiting, if any: {:18581}  Pain issues, if any:  {:18581}  SAFETY ISSUES: Prior radiation? {:18581} Pacemaker/ICD? {:18581} Possible current pregnancy? {:18581} Is the patient on methotrexate? {:18581}  Current Complaints / other details:  ***

## 2023-05-30 NOTE — Progress Notes (Signed)
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 adult who is seen as a courtesy of Dr. Pricilla Holm for an opinion concerning radiation therapy as part of management for her recently diagnosed endometrial cancer.   The patient presented to her OB/GYN, Dr. Nydia Bouton, on 02/05/23 with c/o heavy vaginal bleeding that began in June of 2024. This was described as heavy bleeding with passage of clots initially, requiring the use of 5-6 overnight pads during the day and 1 change overnight.   A pelvic/transvaginal ultrasound was subsequently performed on 02/18/23 which revealed an abnormally thickened and heterogeneous endometrium measuring up to 38.3 mm at the greatest extent, and multiple calcified uterine fibroids, the largest of which measuring up to 2.5 cm at the greatest extent.  She then underwent EUA and D&C on 03/16/23 for endometrial sampling. Pathology showed findings consistent with mixed endometrioid and serous carcinoma; p53 wild-type.   She was accordingly referred to Dr. Pricilla Holm on 03/27/23 for further management. At that time, she reported experiencing constant heavy bleeding with passage of clots (that were reportedly sometimes half the size of her hand) and cramping that started in July and continued throughout August. She also denied every going through menopause at that time, and reported having regular menses until she developed heavy vaginal bleeding in July of 2024.  To complete her  staging work-up, a CT AP was performed on 04/03/23 which demonstrated a retroverted appearing uterus with abnormal thickening of the endometrium measuring 4 cm. CT otherwise showed no evidence of lymphadenopathy.  Based on Dr. Winferd Humphrey recommendation, she underwent a total hysterectomy, BSO, and pelvic SLN excisions on 04/08/23. 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   Molecular studies and [facility] genomic testing also showed:  - MMRd, MLH1 promoter hypermethylation present. - CARIS: MMRd, MSI-H. BRCA1, PTEN mutation.  - ER(2+)/PR(2+) positive - ERRB2 likely pathogenic variant (IHC 0) - KRAS, BRAF, POLE, p53 no mutation  Based on her p53 wild type, Dr. Pricilla Holm has recommended adjuvant radiation therapy which we will discuss in detail today. She is not interested in considering chemotherapy. Her case was also presented at the Gyn-Onc tumor board on 04/27/23. Disposition concluded is to external beam radiation and vaginal brachytherapy depending on P53 results on NGS testing (as noted above). If p53 came back abnormal, the recommended consideration was for chemotherapy with vaginal brachytherapy.   Of note: Pelvic exam performed during her most recent follow-up visit with Dr. Pricilla Holm on 05/08/23 noted discharge as well as pruritus. She also endorsed some vaginal itching at that time. Dr. Pricilla Holm has subsequently prescribed her Diflucan to empirically treat for a  candida infection.     Today the patient states that the pruritus has subsided since completing her Diflucan. She notes some tenderness to the surgical incisions, but this continues to improve since the surgery. She reports constipation which is relieved from Senna-docusate.   PREVIOUS RADIATION THERAPY: No  PAST MEDICAL HISTORY:  Past Medical History:  Diagnosis Date   Complication of anesthesia    needed small tube for intubation   Endometrial cancer (HCC)    Enlarged uterus    Genital pruritus    Obesity, Class III, BMI 40-49.9 (morbid obesity) (HCC)    Post-menopausal bleeding     PAST SURGICAL HISTORY: Past Surgical History:  Procedure Laterality Date   DILATION AND CURETTAGE OF UTERUS  03/16/2023   [facility]   ROBOTIC ASSISTED TOTAL HYSTERECTOMY WITH BILATERAL SALPINGO OOPHERECTOMY N/A 04/08/2023   Procedure: XI ROBOTIC ASSISTED TOTAL HYSTERECTOMY WITH BILATERAL SALPINGO OOPHORECTOMY WITH OMENTECTOMY; CYSTOSCOPY;  Surgeon: Carver Fila, MD;  Location: [facility];  Service: Gynecology;  Laterality: N/A;   SENTINEL NODE BIOPSY N/A 04/08/2023   Procedure: SENTINEL NODE BIOPSY;  Surgeon: Carver Fila, MD;  Location: [facility];  Service: Gynecology;  Laterality: N/A;    FAMILY HISTORY:  Family History  Problem Relation Age of Onset   Cancer Mother    Colon cancer Mother    Cancer Paternal Grandfather     SOCIAL HISTORY:  Social History   Tobacco Use   Smoking status: Never  Vaping Use   Vaping status: Never Used  Substance Use Topics   Alcohol use: No   Drug use: No    ALLERGIES:  Allergies  Allergen Reactions   Menthol Anaphylaxis    MEDICATIONS:  Current Outpatient Medications  Medication Sig Dispense Refill   senna-docusate (SENOKOT-S) 8.6-50 MG tablet Take 2 tablets by mouth at bedtime. For AFTER surgery, do not take if having diarrhea 30 tablet 0   traMADol (ULTRAM) 50 MG tablet Take 1 tablet (50 mg total) by mouth every 6 (six) hours as needed for severe pain  (pain score 7-10). For AFTER surgery only, do not take and drive 10 tablet 0   fluconazole (DIFLUCAN) 150 MG tablet Take 1 tablet (150 mg total) by mouth daily. (Patient not taking: Reported on 06/01/2023) 1 tablet 1   No current facility-administered medications for this encounter.    REVIEW OF SYSTEMS:  Notable for that above.    PHYSICAL EXAM:  height is 5' 8.75" (1.746 m) and weight is 263 lb 3.2 oz (119.4 kg). Her temperature is 97.5 F (36.4 C) (abnormal). Her blood pressure is 142/82 (abnormal) and her pulse is 80. Her respiration is 20 and oxygen saturation is 100%.   General: Alert and oriented, in no acute distress HEENT: Head is normocephalic. Extraocular movements are intact. Oropharynx is clear. Neck: Neck is supple, no palpable cervical or supraclavicular lymphadenopathy. Heart: Regular in rate and rhythm with no murmurs, rubs, or gallops. Chest: Clear to auscultation bilaterally, with no rhonchi, wheezes, or rales. Abdomen: Soft, nontender, nondistended, with no rigidity or guarding. Extremities: No cyanosis or edema. Lymphatics: see Neck Exam Skin: No concerning lesions. Musculoskeletal: symmetric strength and muscle tone throughout. Neurologic: Cranial nerves II through XII are grossly intact. No obvious focalities. Speech is fluent. Coordination is intact. Psychiatric: Judgment and insight are intact. Affect is appropriate.  Pelvic exam deferred. PE from Dr. Pricilla Holm on 05/08/23: "GU: Normal appearing external genitalia without erythema, excoriation, or lesions.  Exam overall poorly tolerated.  Speculum exam reveals vaginal  suture in place, no blood within the vaginal vault, minimal physiologic discharge.  On gentle speculum exam, cuff is intact with suture visible.  Given poor toleration of bimanual exam, unable to palpate cuff. "   ECOG = 1  0 - Asymptomatic (Fully active, able to carry on all predisease activities without restriction)  1 - Symptomatic but completely  ambulatory (Restricted in physically strenuous activity but ambulatory and able to carry out work of a light or sedentary nature. For example, light housework, office work)  2 - Symptomatic, <50% in bed during the day (Ambulatory and capable of all self care but unable to carry out any work activities. Up and about more than 50% of waking hours)  3 - Symptomatic, >50% in bed, but not bedbound (Capable of only limited self-care, confined to bed or chair 50% or more of waking hours)  4 - Bedbound (Completely disabled. Cannot carry on any self-care. Totally confined to bed or chair)  5 - Death   Santiago Glad MM, Creech RH, Tormey DC, et al. [phone]). "Toxicity and response criteria of the [facility]". Am. Evlyn Clines. Oncol. 5 (6): 649-55  LABORATORY DATA:  Lab Results  Component Value Date   WBC 11.3 (H) 04/09/2023   HGB 9.4 (L) 04/09/2023   HCT 30.5 (L) 04/09/2023   MCV 78.8 (L) 04/09/2023   PLT 259 04/09/2023   Lab Results  Component Value Date   NA 137 04/09/2023   K 4.3 04/09/2023   CL 107 04/09/2023   CO2 21 (L) 04/09/2023   GLUCOSE 128 (H) 04/09/2023   BUN 9 04/09/2023   CREATININE 0.71 04/09/2023   CALCIUM 9.2 04/09/2023      RADIOGRAPHY: No results found.    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 If her p53 is abnormal, the recommendation was to proceed with consideration for chemotherapy and vaginal brachytherapy. Patient declines chemotherapy consideration. Patient also declines external beam radiation given the potential side effects and distance with daily travel from her residence (> 1 hour). Dr. Roselind Messier has recommended 5 fractions of adjuvant vaginal brachytherapy to decrease her risk of locoregional disease recurrence.   Today, we talked to the  patient and family about the findings and work-up thus far.  We discussed the natural history of endometrial cancer and general treatment, highlighting the role of radiotherapy in the management.  We discussed the available radiation techniques, and focused on the details of logistics and delivery.  We reviewed the anticipated acute and late sequelae associated with radiation in this setting.  The patient was encouraged to ask questions that we answered to the best of our ability. A patient consent form was discussed and signed.  We retained a copy for our records.  The patient would like to proceed with radiation and will be scheduled for CT simulation.  PLAN: Anticipate 5 fractions of brachytherapy directed at the vaginal cuff using [id number] as the source. Patient will be 8 weeks post-surgery on 06/09/23, so her first treatment will be scheduled after that date to allow for adequate healing time.     60 minutes of total time was spent for this patient encounter, including preparation, face-to-face counseling with the patient and coordination of care, physical exam, and documentation of the encounter.   ------------------------------------------------   Bryan Lemma, PA-C   Fayrene Fearing  Carley Hammed, PhD, MD   [facility]  Radiation Oncology Direct Dial: [phone]  Fax: [phone] [email or web address]   This document serves as a record of services personally performed by Antony Blackbird, MD and Bryan Lemma, PA-C. It was created on his behalf by Neena Rhymes, a trained medical scribe. The creation of this record is based on the scribe's personal observations and the provider's statements to them. This document has been checked and approved by the attending provider.

## 2023-06-01 ENCOUNTER — Other Ambulatory Visit: Payer: Self-pay

## 2023-06-01 ENCOUNTER — Ambulatory Visit
Admission: RE | Admit: 2023-06-01 | Discharge: 2023-06-01 | Disposition: A | Discharge status: Home / Self Care | Payer: Medicare Other | Source: Ambulatory Visit | Attending: Radiation Oncology | Admitting: Radiation Oncology

## 2023-06-01 ENCOUNTER — Encounter: Payer: Self-pay | Admitting: Radiation Oncology

## 2023-06-01 VITALS — BP 142/82 | HR 80 | Temp 97.5°F | Resp 20 | Ht 68.75 in | Wt 263.2 lb

## 2023-06-01 DIAGNOSIS — Z800 Family history of malignant neoplasm of digestive organs: Secondary | ICD-10-CM | POA: Diagnosis not present

## 2023-06-01 DIAGNOSIS — K5900 Constipation, unspecified: Secondary | ICD-10-CM | POA: Diagnosis not present

## 2023-06-01 DIAGNOSIS — Z1509 Genetic susceptibility to other malignant neoplasm: Secondary | ICD-10-CM | POA: Diagnosis not present

## 2023-06-01 DIAGNOSIS — Z90722 Acquired absence of ovaries, bilateral: Secondary | ICD-10-CM | POA: Diagnosis not present

## 2023-06-01 DIAGNOSIS — C541 Malignant neoplasm of endometrium: Secondary | ICD-10-CM | POA: Insufficient documentation

## 2023-06-01 DIAGNOSIS — Z90710 Acquired absence of both cervix and uterus: Secondary | ICD-10-CM | POA: Diagnosis not present

## 2023-06-04 ENCOUNTER — Telehealth: Payer: Self-pay

## 2023-06-04 NOTE — Telephone Encounter (Signed)
Per provider, pt was rescheduled from 4/11  to 4/25 @ 3:45. Pt agreed to date and time

## 2023-06-10 ENCOUNTER — Telehealth: Payer: Self-pay | Admitting: Radiation Oncology

## 2023-06-10 ENCOUNTER — Telehealth: Payer: Self-pay | Admitting: *Deleted

## 2023-06-10 NOTE — Telephone Encounter (Signed)
 CALLED PATIENT TO INFORM OF NEW HDR [facility] FOR 06-11-23, SPOKE WITH PATIENT AND SHE IS AWARE OF THESE APPTS.

## 2023-06-10 NOTE — Progress Notes (Incomplete)
  Radiation Oncology         [phone] ________________________________  Name: Carolyn Bradley MRN: [id number]  Date: 06/11/2023  DOB: 08/20/1955  Follow-Up Visit Note  CC: Patient, No Pcp Per  Carver Fila, MD  No diagnosis found.  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  She was last seen in office on 06-01-23 for a consultation of endometrial cancer. Patient has not undergone any imaging or had any follow up visits. She is scheduled to start HDR treatments on 06-11-23.                             Allergies:  is allergic to menthol.  Meds: Current Outpatient Medications  Medication Sig Dispense Refill   fluconazole (DIFLUCAN) 150 MG tablet Take 1 tablet (150 mg total) by mouth daily. (Patient not taking: Reported on 06/01/2023) 1 tablet 1   senna-docusate (SENOKOT-S) 8.6-50 MG tablet Take 2 tablets by mouth at bedtime. For AFTER surgery, do not take if having diarrhea 30 tablet 0   traMADol (ULTRAM) 50 MG tablet Take 1 tablet (50 mg total) by mouth every 6 (six) hours as needed for severe pain (pain score 7-10). For AFTER surgery only, do not take and drive 10 tablet 0   No current facility-administered medications for this encounter.    Physical Findings: The patient is in no acute distress. Patient is alert and oriented.  vitals were not taken for this visit. .  No significant changes. Lungs are clear to auscultation bilaterally. Heart has regular rate and rhythm. No palpable cervical, supraclavicular, or axillary adenopathy. Abdomen soft, non-tender, normal bowel sounds.   Lab Findings: Lab Results  Component Value Date   WBC 11.3 (H) 04/09/2023   HGB 9.4 (L) 04/09/2023   HCT  30.5 (L) 04/09/2023   MCV 78.8 (L) 04/09/2023   PLT 259 04/09/2023    Radiographic Findings: No results found.  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  ***  Plan:  ***   *** minutes of total time was spent for this patient encounter, including preparation, face-to-face counseling with the patient and coordination of care, physical exam, and documentation of the encounter. ____________________________________  Billie Lade, PhD, MD  This document serves as a record of services personally performed by Antony Blackbird, MD. It was created on his behalf by Herbie Saxon, a trained medical scribe. The creation of this record is based on the scribe's personal observations and the provider's statements to them. This document has been checked and approved by the attending provider.

## 2023-06-10 NOTE — Telephone Encounter (Signed)
 Received call from pt asking to cancel all radiation appts. When asked why, pt states she recently had full hysterectomy under the care of Dr. Viktoria. While Dr. Viktoria does recommend radiation, pt feels it would not benefit her in the long run. She states this tx could increase her chances of colon cancer in the future and she does not feel like it is needed since all active cells were removed with surgery. Pt was advised that she may receive a call from the clinical team but they will be made aware and appts will be canceled by them. She verbalized understanding and gratitude for understanding.

## 2023-06-11 ENCOUNTER — Ambulatory Visit: Payer: Medicare Other | Admitting: Radiation Oncology

## 2023-06-11 ENCOUNTER — Ambulatory Visit
Admission: RE | Admit: 2023-06-11 | Discharge: 2023-06-11 | Disposition: A | Discharge status: Home / Self Care | Payer: Medicare Other | Source: Ambulatory Visit | Attending: Radiation Oncology | Admitting: Radiation Oncology

## 2023-06-11 ENCOUNTER — Ambulatory Visit: Payer: Medicare Other

## 2023-06-15 ENCOUNTER — Telehealth: Payer: Self-pay | Admitting: Radiation Oncology

## 2023-06-15 NOTE — Telephone Encounter (Signed)
 Earlier today I spoke with Carolyn Bradley concerning her vaginal brachytherapy.  She initially wished to proceed with this treatment to reduce the chances for vaginal cuff recurrence however after additional consideration she has decided to forego radiation therapy.  I recommended she contact Dr. Danniel Duverney office to be scheduled for appropriate follow-up.  Noralee Beam, MD

## 2023-06-15 NOTE — Telephone Encounter (Signed)
 Thanks Dr. Eloise Hake. Could someone reach out to her and get her scheduled for follow-up with me in 3 months? Thank you

## 2023-06-16 ENCOUNTER — Ambulatory Visit
Admission: RE | Admit: 2023-06-16 | Discharge: 2023-06-16 | Disposition: A | Discharge status: Home / Self Care | Payer: Medicare Other | Source: Ambulatory Visit | Attending: Radiation Oncology | Admitting: Radiation Oncology

## 2023-06-25 ENCOUNTER — Ambulatory Visit: Payer: Medicare Other | Admitting: Radiation Oncology

## 2023-07-02 ENCOUNTER — Ambulatory Visit: Payer: Medicare Other | Admitting: Radiation Oncology

## 2023-07-09 ENCOUNTER — Ambulatory Visit: Payer: Medicare Other | Admitting: Radiation Oncology

## 2023-07-28 ENCOUNTER — Telehealth: Payer: Self-pay | Admitting: *Deleted

## 2023-07-28 NOTE — Telephone Encounter (Signed)
 Spoke with the patient and moved appt from 5/8 to 5/15

## 2023-08-14 ENCOUNTER — Ambulatory Visit: Payer: Medicare Other | Admitting: Gynecologic Oncology

## 2023-08-14 ENCOUNTER — Telehealth: Payer: Self-pay

## 2023-08-14 NOTE — Telephone Encounter (Signed)
 Pt called to reschedule her upcoming appointment with Dr.Tucker on 5/15, stating she picks up her grandkids from school and prefers a morning appointment.   Next available appointment is 6/6 @ 8:45. Pt agreed to date/time and was thankful

## 2023-08-28 ENCOUNTER — Ambulatory Visit: Payer: Self-pay | Admitting: Gynecologic Oncology

## 2023-09-10 ENCOUNTER — Ambulatory Visit: Payer: Self-pay | Admitting: Gynecologic Oncology

## 2023-09-17 ENCOUNTER — Ambulatory Visit: Payer: Self-pay | Admitting: Gynecologic Oncology

## 2023-09-29 ENCOUNTER — Ambulatory Visit (INDEPENDENT_AMBULATORY_CARE_PROVIDER_SITE_OTHER): Payer: Self-pay | Admitting: Nurse Practitioner

## 2023-09-29 ENCOUNTER — Encounter: Payer: Self-pay | Admitting: Nurse Practitioner

## 2023-09-29 VITALS — BP 153/74 | HR 62 | Temp 97.0°F | Ht 68.75 in | Wt 267.0 lb

## 2023-09-29 DIAGNOSIS — C541 Malignant neoplasm of endometrium: Secondary | ICD-10-CM

## 2023-09-29 DIAGNOSIS — E6609 Other obesity due to excess calories: Secondary | ICD-10-CM

## 2023-09-29 DIAGNOSIS — E66812 Obesity, class 2: Secondary | ICD-10-CM

## 2023-09-29 DIAGNOSIS — I10 Essential (primary) hypertension: Secondary | ICD-10-CM | POA: Diagnosis not present

## 2023-09-29 DIAGNOSIS — Z6839 Body mass index (BMI) 39.0-39.9, adult: Secondary | ICD-10-CM

## 2023-09-29 DIAGNOSIS — R7303 Prediabetes: Secondary | ICD-10-CM | POA: Diagnosis not present

## 2023-09-29 DIAGNOSIS — E785 Hyperlipidemia, unspecified: Secondary | ICD-10-CM

## 2023-09-29 DIAGNOSIS — E669 Obesity, unspecified: Secondary | ICD-10-CM | POA: Insufficient documentation

## 2023-09-29 HISTORY — DX: Hyperlipidemia, unspecified: E78.5

## 2023-09-29 HISTORY — DX: Prediabetes: R73.03

## 2023-09-29 NOTE — Assessment & Plan Note (Addendum)
 Lab Results  Component Value Date   HGBA1C 5.8 (H) 03/31/2023  Patient counseled on low-carb diet Encouraged to lose weight Recheck labs at next visit

## 2023-09-29 NOTE — Assessment & Plan Note (Signed)
 Not interested in starting medication Encouraged to monitor blood pressure at home and call the office with blood pressure readings consistently greater than 140/90 DASH diet and commitment to daily physical activity for a minimum of 30 minutes discussed and encouraged, as a part of hypertension management. The importance of attaining a healthy weight is also discussed.     09/29/2023   12:04 PM 09/29/2023   11:43 AM 09/29/2023   11:23 AM 06/01/2023   12:42 PM 06/01/2023   12:19 PM 05/08/2023    3:22 PM 05/08/2023    3:01 PM  BP/Weight  Systolic BP 153 160 149 142 157 152 160  Diastolic BP 74 82 81 82 94 88 79  Wt. (Lbs)   267 263.2 263.13  264.8  BMI   39.72 kg/m2 39.15 kg/m2 40.01 kg/m2  40.26 kg/m2

## 2023-09-29 NOTE — Assessment & Plan Note (Signed)
 Lab Results  Component Value Date   CHOL 204 (H) 03/31/2023   HDL 62 03/31/2023   LDLCALC 125 (H) 03/31/2023   TRIG 96 03/31/2023   CHOLHDL 3.3 03/31/2023  Avoid fatty fried foods Lose weight Recheck labs at next visit

## 2023-09-29 NOTE — Progress Notes (Signed)
 Established Patient Office Visit  Subjective:  Patient ID: Carolyn Bradley, female    DOB: 09-30-1955  Age: 68 y.o. MRN: [id number]  CC: No chief complaint on file.   HPI Carolyn Bradley is a 68 y.o. female  has a past medical history of Complication of anesthesia, Dyslipidemia (09/29/2023), Endometrial cancer (HCC), Enlarged uterus, Genital pruritus, Obesity, Class III, BMI 40-49.9 (morbid obesity), Post-menopausal bleeding, and Prediabetes (09/29/2023).  Patient presents for follow-up for her chronic medical condition.  Previous PCP Gildardo Labrador NP  Endometrial cancer.  Followed by oncology status post robotic assisted laparoscopic total hysterectomy with bilateral salpingoophrectomy in December 2024.  She had declined chemotherapy and radiation treatment, has upcoming appointment with oncology.  Currently denies abnormal bleeding  Hypertension.  Currently not on medication, states that her blood pressure is always elevated when at the doctor's offices.  Checks her blood pressure daily at home and it has always been less than 120/80.  Currently denies chest pain, shortness of breath, edema  She declined all vaccines due and declined mammogram, colon cancer screening        Past Medical History:  Diagnosis Date   Complication of anesthesia    needed small tube for intubation   Dyslipidemia 09/29/2023   Endometrial cancer (HCC)    Enlarged uterus    Genital pruritus    Obesity, Class III, BMI 40-49.9 (morbid obesity)    Post-menopausal bleeding    Prediabetes 09/29/2023    Past Surgical History:  Procedure Laterality Date   DILATION AND CURETTAGE OF UTERUS  03/16/2023   [facility]   ROBOTIC ASSISTED TOTAL HYSTERECTOMY WITH BILATERAL SALPINGO OOPHERECTOMY N/A 04/08/2023   Procedure: XI ROBOTIC ASSISTED TOTAL HYSTERECTOMY WITH BILATERAL SALPINGO OOPHORECTOMY WITH OMENTECTOMY; CYSTOSCOPY;  Surgeon: Suzi Essex, MD;  Location: [facility];  Service: Gynecology;   Laterality: N/A;   SENTINEL NODE BIOPSY N/A 04/08/2023   Procedure: SENTINEL NODE BIOPSY;  Surgeon: Suzi Essex, MD;  Location: [facility];  Service: Gynecology;  Laterality: N/A;    Family History  Problem Relation Age of Onset   Cancer Mother    Colon cancer Mother    Cancer Paternal Grandfather     Social History   Socioeconomic History   Marital status: Widowed    Spouse name: Not on file   Number of children: 2   Years of education: Not on file   Highest education level: Not on file  Occupational History   Not on file  Tobacco Use   Smoking status: Never   Smokeless tobacco: Not on file  Vaping Use   Vaping status: Never Used  Substance and Sexual Activity   Alcohol use: No   Drug use: No   Sexual activity: Not Currently  Other Topics Concern   Not on file  Social History Narrative   Lives home alone    Social Drivers of Health   Financial Resource Strain: Not on file  Food Insecurity: No Food Insecurity (06/01/2023)   Hunger Vital Sign    Worried About Running Out of Food in the Last Year: Never true    Ran Out of Food in the Last Year: Never true  Transportation Needs: No Transportation Needs (06/01/2023)   PRAPARE - Administrator, Civil Service (Medical): No    Lack of Transportation (Non-Medical): No  Physical Activity: Not on file  Stress: Not on file  Social Connections: Not on file  Intimate Partner Violence: Not At Risk (06/01/2023)   Humiliation,  Afraid, Rape, and Kick questionnaire    Fear of Current or Ex-Partner: No    Emotionally Abused: No    Physically Abused: No    Sexually Abused: No    Outpatient Medications Prior to Visit  Medication Sig Dispense Refill   clobetasol ointment (TEMOVATE) 0.05 % Apply 1 Application topically 2 (two) times daily. (Patient not taking: Reported on 09/29/2023)     senna-docusate (SENOKOT-S) 8.6-50 MG tablet Take 2 tablets by mouth at bedtime. For AFTER surgery, do not take if having diarrhea  (Patient not taking: Reported on 09/29/2023) 30 tablet 0   traMADol  (ULTRAM ) 50 MG tablet Take 1 tablet (50 mg total) by mouth every 6 (six) hours as needed for severe pain (pain score 7-10). For AFTER surgery only, do not take and drive (Patient not taking: Reported on 09/29/2023) 10 tablet 0   fluconazole  (DIFLUCAN ) 150 MG tablet Take 1 tablet (150 mg total) by mouth daily. (Patient not taking: Reported on 09/29/2023) 1 tablet 1   No facility-administered medications prior to visit.    Allergies  Allergen Reactions   Menthol Anaphylaxis    ROS Review of Systems  Constitutional:  Negative for appetite change, chills, fatigue and fever.  HENT:  Negative for congestion, postnasal drip, rhinorrhea and sneezing.   Respiratory:  Negative for cough, shortness of breath and wheezing.   Cardiovascular:  Negative for chest pain, palpitations and leg swelling.  Gastrointestinal:  Negative for abdominal pain, constipation, nausea and vomiting.  Genitourinary:  Negative for difficulty urinating, dysuria, flank pain and frequency.  Musculoskeletal:  Negative for arthralgias, back pain, joint swelling and myalgias.  Skin:  Negative for color change, pallor, rash and wound.  Neurological:  Negative for dizziness, facial asymmetry, weakness, numbness and headaches.  Psychiatric/Behavioral:  Negative for behavioral problems, confusion, self-injury and suicidal ideas.       Objective:     Physical Exam Vitals and nursing note reviewed.  Constitutional:      General: She is not in acute distress.    Appearance: Normal appearance. She is obese. She is not ill-appearing, toxic-appearing or diaphoretic.  Eyes:     General: No scleral icterus.       Right eye: No discharge.        Left eye: No discharge.     Extraocular Movements: Extraocular movements intact.     Conjunctiva/sclera: Conjunctivae normal.  Cardiovascular:     Rate and Rhythm: Normal rate and regular rhythm.     Pulses: Normal  pulses.     Heart sounds: Normal heart sounds. No murmur heard.    No friction rub. No gallop.  Pulmonary:     Effort: Pulmonary effort is normal. No respiratory distress.     Breath sounds: Normal breath sounds. No stridor. No wheezing, rhonchi or rales.  Chest:     Chest wall: No tenderness.  Abdominal:     General: There is no distension.     Palpations: Abdomen is soft.     Tenderness: There is no abdominal tenderness. There is no right CVA tenderness, left CVA tenderness or guarding.  Musculoskeletal:        General: No swelling, tenderness, deformity or signs of injury.     Right lower leg: No edema.     Left lower leg: No edema.  Skin:    General: Skin is warm and dry.     Capillary Refill: Capillary refill takes less than 2 seconds.     Coloration: Skin is not jaundiced or  pale.     Findings: No bruising, erythema or lesion.  Neurological:     Mental Status: She is alert and oriented to person, place, and time.     Motor: No weakness.     Coordination: Coordination normal.     Gait: Gait normal.  Psychiatric:        Mood and Affect: Mood normal.        Behavior: Behavior normal.        Thought Content: Thought content normal.        Judgment: Judgment normal.     BP (!) 153/74   Pulse 62   Temp (!) 97 F (36.1 C)   Ht 5' 8.75" (1.746 m)   Wt 267 lb (121.1 kg)   SpO2 99%   BMI 39.72 kg/m  Wt Readings from Last 3 Encounters:  09/29/23 267 lb (121.1 kg)  06/01/23 263 lb 3.2 oz (119.4 kg)  05/08/23 264 lb 12.8 oz (120.1 kg)    No results found for: "TSH" Lab Results  Component Value Date   WBC 11.3 (H) 04/09/2023   HGB 9.4 (L) 04/09/2023   HCT 30.5 (L) 04/09/2023   MCV 78.8 (L) 04/09/2023   PLT 259 04/09/2023   Lab Results  Component Value Date   NA 137 04/09/2023   K 4.3 04/09/2023   CO2 21 (L) 04/09/2023   GLUCOSE 128 (H) 04/09/2023   BUN 9 04/09/2023   CREATININE 0.71 04/09/2023   BILITOT 0.4 04/06/2023   ALKPHOS 85 04/06/2023   AST 16  04/06/2023   ALT 13 04/06/2023   PROT 7.1 04/06/2023   ALBUMIN 3.6 04/06/2023   CALCIUM 9.2 04/09/2023   ANIONGAP 9 04/09/2023   Lab Results  Component Value Date   CHOL 204 (H) 03/31/2023   Lab Results  Component Value Date   HDL 62 03/31/2023   Lab Results  Component Value Date   LDLCALC 125 (H) 03/31/2023   Lab Results  Component Value Date   TRIG 96 03/31/2023   Lab Results  Component Value Date   CHOLHDL 3.3 03/31/2023   Lab Results  Component Value Date   HGBA1C 5.8 (H) 03/31/2023      Assessment & Plan:   Problem List Items Addressed This Visit       Cardiovascular and Mediastinum   High blood pressure   Not interested in starting medication Encouraged to monitor blood pressure at home and call the office with blood pressure readings consistently greater than 140/90 DASH diet and commitment to daily physical activity for a minimum of 30 minutes discussed and encouraged, as a part of hypertension management. The importance of attaining a healthy weight is also discussed.     09/29/2023   12:04 PM 09/29/2023   11:43 AM 09/29/2023   11:23 AM 06/01/2023   12:42 PM 06/01/2023   12:19 PM 05/08/2023    3:22 PM 05/08/2023    3:01 PM  BP/Weight  Systolic BP 153 160 149 142 157 152 160  Diastolic BP 74 82 81 82 94 88 79  Wt. (Lbs)   267 263.2 263.13  264.8  BMI   39.72 kg/m2 39.15 kg/m2 40.01 kg/m2  40.26 kg/m2             Genitourinary   Endometrial cancer (HCC) - Primary   Followed by oncology She denies any concerns today        Other   Obesity   Wt Readings from Last 3 Encounters:  09/29/23 267 lb (  121.1 kg)  06/01/23 263 lb 3.2 oz (119.4 kg)  05/08/23 264 lb 12.8 oz (120.1 kg)   Body mass index is 39.72 kg/m.  States that she is not sedentary, does walking exercises daily and also uses exercise bike Stated that she has been following a low-carb diet at home Encouraged to engage in regular moderate exercises at least 150 minutes  weekly Patient counseled on a high-protein low carbohydrate diet Not interested in starting medication      Dyslipidemia   Lab Results  Component Value Date   CHOL 204 (H) 03/31/2023   HDL 62 03/31/2023   LDLCALC 125 (H) 03/31/2023   TRIG 96 03/31/2023   CHOLHDL 3.3 03/31/2023  Avoid fatty fried foods Lose weight Recheck labs at next visit      Prediabetes   Lab Results  Component Value Date   HGBA1C 5.8 (H) 03/31/2023  Patient counseled on low-carb diet Encouraged to lose weight Recheck labs at next visit       No orders of the defined types were placed in this encounter.   Follow-up: Return in about 6 months (around 03/31/2024) for CPE.    Remy Voiles R Lovinia Snare, FNP

## 2023-09-29 NOTE — Patient Instructions (Signed)
 Around 3 times per week, check your blood pressure 2 times per day. once in the morning and once in the evening. The readings should be at least one minute apart. Write down these values and bring them to your next nurse visit/appointment.  When you check your BP, make sure you have been doing something calm/relaxing 5 minutes prior to checking. Both feet should be flat on the floor and you should be sitting. Use your left arm and make sure it is in a relaxed position (on a table), and that the cuff is at the approximate level/height of your heart.  Blood pressure goal is less than 140/90.  Please call the office for blood pressure readings consistently greater than 140/90.    It is important that you exercise regularly at least 30 minutes 5 times a week as tolerated  Think about what you will eat, plan ahead. Choose " clean, green, fresh or frozen" over canned, processed or packaged foods which are more sugary, salty and fatty. 70 to 75% of food eaten should be vegetables and fruit. Three meals at set times with snacks allowed between meals, but they must be fruit or vegetables. Aim to eat over a 12 hour period , example 7 am to 7 pm, and STOP after  your last meal of the day. Drink water ,generally about 64 ounces per day, no other drink is as healthy. Fruit juice is best enjoyed in a healthy way, by EATING the fruit.  Thanks for choosing [facility] we consider it a privelige to serve you.

## 2023-09-29 NOTE — Assessment & Plan Note (Signed)
 Followed by oncology She denies any concerns today

## 2023-09-29 NOTE — Assessment & Plan Note (Signed)
 Wt Readings from Last 3 Encounters:  09/29/23 267 lb (121.1 kg)  06/01/23 263 lb 3.2 oz (119.4 kg)  05/08/23 264 lb 12.8 oz (120.1 kg)   Body mass index is 39.72 kg/m.  States that she is not sedentary, does walking exercises daily and also uses exercise bike Stated that she has been following a low-carb diet at home Encouraged to engage in regular moderate exercises at least 150 minutes weekly Patient counseled on a high-protein low carbohydrate diet Not interested in starting medication

## 2023-10-08 ENCOUNTER — Encounter: Payer: Self-pay | Admitting: Gynecologic Oncology

## 2023-10-09 ENCOUNTER — Encounter: Payer: Self-pay | Admitting: Gynecologic Oncology

## 2023-10-09 ENCOUNTER — Inpatient Hospital Stay: Payer: Self-pay | Attending: Gynecologic Oncology | Admitting: Gynecologic Oncology

## 2023-10-09 VITALS — BP 117/63 | HR 77 | Temp 98.1°F | Resp 16 | Wt 263.0 lb

## 2023-10-09 DIAGNOSIS — Z1509 Genetic susceptibility to other malignant neoplasm: Secondary | ICD-10-CM | POA: Insufficient documentation

## 2023-10-09 DIAGNOSIS — Z8542 Personal history of malignant neoplasm of other parts of uterus: Secondary | ICD-10-CM | POA: Diagnosis not present

## 2023-10-09 DIAGNOSIS — Z90722 Acquired absence of ovaries, bilateral: Secondary | ICD-10-CM | POA: Diagnosis not present

## 2023-10-09 DIAGNOSIS — C541 Malignant neoplasm of endometrium: Secondary | ICD-10-CM

## 2023-10-09 DIAGNOSIS — Z90710 Acquired absence of both cervix and uterus: Secondary | ICD-10-CM | POA: Insufficient documentation

## 2023-10-09 DIAGNOSIS — D3502 Benign neoplasm of left adrenal gland: Secondary | ICD-10-CM | POA: Diagnosis not present

## 2023-10-09 NOTE — Patient Instructions (Signed)
 It was good to see you today.  I do not see or feel any evidence of cancer recurrence on your exam.  I will see you for follow-up in 3 months.  As always, if you develop any new and concerning symptoms before your next visit, please call to see me sooner.

## 2023-10-09 NOTE — Progress Notes (Signed)
 Gynecologic Oncology Return Clinic Visit  10/09/23  Reason for Visit: follow-up  Treatment History: Oncology History Overview Note  The patient was initially seen on 02/05/2023 for postmenopausal bleeding.  This was described as heavy vaginal bleeding over 4 months.  At that time she denied ever having sensation of her menses but that current, heavier bleeding started in June 2024.  This was described as heavy bleeding with passage of clots initially, requiring the use of 5-6 overnight pads during the day and 1 change overnight.  dMMR P53 WT   Endometrial cancer (HCC)  02/18/2023 Imaging   Pelvic ultrasound exam: Uterus measures 7.7 x 6.5 x 7.2 cm.  Multiple calcified uterine fibroids noted, largest measuring up to 2.5 cm. Endometrium 3.8 cm, diffusely heterogenous. Right ovary normal in appearance.  Left ovary not visualized. No free fluid.   03/16/2023 Surgery   EUA, D&C for persistent PMB    03/16/2023 Pathology Results   Endometrial curettings: Mixed endometrioid and serous carcinoma.  Strong block positivity for p16.  P53 appears wild-type.   03/27/2023 Initial Diagnosis   Endometrial cancer (HCC)   04/03/2023 Imaging   CT C/A/P:  Retroverted uterus with abnormal thickening of the endometrium as described on ultrasound. No specific abnormal lymph node enlargement identified. No parametrial soft tissues. Separate calcified uterine fibroids. No acute cardiopulmonary disease. No dominant lung nodules. No bowel obstruction. Few sigmoid colon diverticula. Benign-appearing left-sided renal cysts. Presumed left adrenal adenoma but not confirmed on this examination. Please correlate with any prior versus MRI in and out of phase evaluation or short follow up   04/08/2023 Surgery   Robotic-assisted laparoscopic total hysterectomy with bilateral salpingoophorectomy, SLN biopsy    04/08/2023 Pathology Results   A. LEFT EXTERNAL ILIAC SENTINEL LYMPH NODE, EXCISION:               One  benign lymph node, negative for carcinoma (0/1)  B. RIGHT INTERNAL ILIAC SENTINEL LYMPH NODE, EXCISION:               One benign lymph node, negative for carcinoma (0/1)  C. 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 OMENTUM, OMENTECTOMY:              Benign omentum, negative for tumor  ONCOLOGY TABLE:  UTERUS, CARCINOMA OR CARCINOSARCOMA: Resection  Procedure: Total hysterectomy and bilateral salpingo-oophorectomy with sentinel nodes omentectomy Histologic Type: Endometrioid adenocarcinoma Histologic Grade: Moderate to poorly differentiated, FIGO 3 Myometrial Invasion:      Depth of Myometrial Invasion (mm): 22 mm      Myometrial Thickness (mm): 30 mm      Percentage of Myometrial Invasion: 73% Uterine Serosa Involvement: Not identified Cervical stromal Involvement: Not identified Extent of involvement of other tissue/organs: Not identified Peritoneal/Ascitic Fluid: Negative ([id number]) Lymphovascular Invasion: Not identified Regional Lymph Nodes:      Pelvic Lymph Nodes Examined: 2 sentinel lymph nodes      Pelvic Lymph Nodes with Metastasis: 0          Macrometastasis: (>2.0 mm): 0  Micrometastasis: (>0.2 mm and < 2.0 mm): 0          Isolated Tumor Cells (<0.2 mm): 0          Laterality of Lymph Node with Tumor: NA          Extracapsular Extension: NA Distant Metastasis: Not applicable Pathologic  Stage Classification (pTNM, AJCC 8th Edition): pT1b, pN0 FIGO Stage (2023 staging for cancer of the endometrium): IIC FIGO Modified Classification: mMMRd Ancillary Studies: MMR testing shows MLH-1 and PMS2 loss Representative Tumor Block: C5, C7 Comment(s): A pancytokeratin immunohistochemical stains performed lymph nodes is negative with adequate control. (v4.2.0.1)  COMMENT:  Immunohistochemical stain for p53 was performed and is wild type pattern of staining.    04/08/2023 Pathology Results   CARIS  MMRd, MSI-H, TMB H (27 mut/Mb) ER(2+)/PR(2+) positive BRCA1 mutation ARID1A, PTEN mutations ERRB2 likely pathogenic variant (IHC 0) KRAS, BRAF, POLE, p53 no mutation     Interval History: Doing very well.  Denies any vaginal bleeding.  Denies abdominal or pelvic pain.  Has been enjoying working in her yard/garden.  Reports baseline bowel bladder function.  Past Medical/Surgical History: Past Medical History:  Diagnosis Date   Complication of anesthesia    needed small tube for intubation   Dyslipidemia 09/29/2023   Endometrial cancer (HCC)    Enlarged uterus    Genital pruritus    Obesity, Class III, BMI 40-49.9 (morbid obesity)    Post-menopausal bleeding    Prediabetes 09/29/2023    Past Surgical History:  Procedure Laterality Date   DILATION AND CURETTAGE OF UTERUS  03/16/2023   [facility]   ROBOTIC ASSISTED TOTAL HYSTERECTOMY WITH BILATERAL SALPINGO OOPHERECTOMY N/A 04/08/2023   Procedure: XI ROBOTIC ASSISTED TOTAL HYSTERECTOMY WITH BILATERAL SALPINGO OOPHORECTOMY WITH OMENTECTOMY; CYSTOSCOPY;  Surgeon: Suzi Essex, MD;  Location: [facility];  Service: Gynecology;  Laterality: N/A;   SENTINEL NODE BIOPSY N/A 04/08/2023   Procedure: SENTINEL NODE BIOPSY;  Surgeon: Suzi Essex, MD;  Location: [facility];  Service: Gynecology;  Laterality: N/A;    Family History  Problem Relation Age of Onset   Cancer Mother    Colon cancer Mother    Cancer Paternal  Grandfather     Social History   Socioeconomic History   Marital status: Widowed    Spouse name: Not on file   Number of children: 2   Years of education: Not on file   Highest education level: Not on file  Occupational History   Not on file  Tobacco Use   Smoking status: Never   Smokeless tobacco: Not on file  Vaping Use   Vaping status: Never Used  Substance and Sexual Activity   Alcohol use: No   Drug use: No   Sexual activity: Not Currently  Other Topics Concern   Not on file  Social History Narrative   Lives home alone    Social Drivers of Health   Financial Resource Strain: Not on file  Food Insecurity: No Food Insecurity (06/01/2023)   Hunger Vital Sign    Worried About Running Out of Food in the Last Year: Never true    Ran Out of Food in the Last Year: Never true  Transportation Needs: No Transportation Needs (06/01/2023)   PRAPARE - Administrator, Civil Service (Medical): No    Lack of Transportation (Non-Medical): No  Physical Activity: Not on file  Stress: Not on file  Social Connections: Not on file    Current Medications: No current  outpatient medications on file.  Review of Systems: Denies appetite changes, fevers, chills, fatigue, unexplained weight changes. Denies hearing loss, neck lumps or masses, mouth sores, ringing in ears or voice changes. Denies cough or wheezing.  Denies shortness of breath. Denies chest pain or palpitations. Denies leg swelling. Denies abdominal distention, pain, blood in stools, constipation, diarrhea, nausea, vomiting, or early satiety. Denies pain with intercourse, dysuria, frequency, hematuria or incontinence. Denies hot flashes, pelvic pain, vaginal bleeding or vaginal discharge.   Denies joint pain, back pain or muscle pain/cramps. Denies itching, rash, or wounds. Denies dizziness, headaches, numbness or seizures. Denies swollen lymph nodes or glands, denies easy bruising or bleeding. Denies anxiety,  depression, confusion, or decreased concentration.  Physical Exam: BP 117/63 (BP Location: Right Arm, Patient Position: Sitting)   Pulse 77   Temp 98.1 F (36.7 C)   Resp 16   Wt 263 lb (119.3 kg)   SpO2 99%   BMI 39.12 kg/m  General: Alert, oriented, no acute distress. HEENT: Posterior oropharynx clear, sclera anicteric. Chest: Clear to auscultation bilaterally.  No wheezes or rhonchi. Cardiovascular: Regular rate and rhythm, no murmurs. Abdomen: Obese, soft, nontender.  Normoactive bowel sounds.  No masses or hepatosplenomegaly appreciated.  Well-healed incisions. Extremities: Grossly normal range of motion.  Warm, well perfused.  No edema bilaterally. Skin: No rashes or lesions noted. Lymphatics: No cervical, supraclavicular, or inguinal adenopathy. GU: Normal appearing external genitalia without erythema, excoriation, or lesions.  Speculum exam reveals cuff intact, mild atrophy noted, no lesions.  Bimanual exam reveals no masses or nodularity.  Laboratory & Radiologic Studies: None new  Assessment & Plan: Carolyn Bradley is a 68 y.o. 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  MMRd, MLH1 promoter hypermethylation present. CARIS: MMRd, MSI-H. BRCA1, PTEN mutation.  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  Patient is overall doing well, NED on exam today.  Given findings of likely left adrenal adenoma, additional follow-up imaging recommended by radiology.  Discussed repeat CT scan versus MRI with the patient.  MRI ordered.  Per NCCN surveillance recommendations, we discussed plan for visits every 3 months in the setting of her high-immediate risk endometrial cancer.  We reviewed signs and symptoms that  would be concerning for possible recurrence, and I stressed the importance of calling between visits if she develops any of the symptoms.  22 minutes of total time was spent for this patient encounter, including preparation, face-to-face counseling with the patient and coordination of care, and documentation of the encounter.  Wiley Hanger, MD  Division of Gynecologic Oncology  Department of Obstetrics and Gynecology  [facility]

## 2023-10-14 ENCOUNTER — Ambulatory Visit (HOSPITAL_COMMUNITY)
Admission: RE | Admit: 2023-10-14 | Discharge: 2023-10-14 | Disposition: A | Discharge status: Home / Self Care | Source: Ambulatory Visit | Attending: Gynecologic Oncology | Admitting: Gynecologic Oncology

## 2023-10-14 DIAGNOSIS — D3502 Benign neoplasm of left adrenal gland: Secondary | ICD-10-CM | POA: Diagnosis not present

## 2023-10-14 MED ORDER — GADOBUTROL 1 MMOL/ML IV SOLN
10.0000 mL | Freq: Once | INTRAVENOUS | Status: AC | PRN
  Administered 2023-10-14: 10 mL via INTRAVENOUS

## 2023-10-19 ENCOUNTER — Telehealth: Payer: Self-pay

## 2023-10-19 ENCOUNTER — Telehealth: Payer: Self-pay | Admitting: Oncology

## 2023-10-19 NOTE — Telephone Encounter (Signed)
 Received a voicemail from patient stating she had an allergic reaction after receiving  IV contrast. Patient reports severe itching, swelling and darkening of bilateral upper extremities. Patient reports skin started to welt up and slough off. Patient applied hydrocortisone cream and calamine lotion which was effective. Gadolinium was added to allergy list.

## 2023-10-19 NOTE — Telephone Encounter (Signed)
 Called Carolyn Bradley and let her know the MRI confirmed a benign adrenal mass per Dr. Orvil Bland.  She verbalized understanding and did not have any further questions.

## 2023-11-10 ENCOUNTER — Other Ambulatory Visit: Payer: Self-pay

## 2023-11-10 ENCOUNTER — Emergency Department (HOSPITAL_COMMUNITY)

## 2023-11-10 ENCOUNTER — Encounter (HOSPITAL_COMMUNITY): Payer: Self-pay | Admitting: *Deleted

## 2023-11-10 ENCOUNTER — Emergency Department (HOSPITAL_COMMUNITY)
Admission: EM | Admit: 2023-11-10 | Discharge: 2023-11-10 | Disposition: A | Discharge status: Home / Self Care | Attending: Emergency Medicine | Admitting: Emergency Medicine

## 2023-11-10 DIAGNOSIS — M79661 Pain in right lower leg: Secondary | ICD-10-CM | POA: Diagnosis not present

## 2023-11-10 DIAGNOSIS — Z8542 Personal history of malignant neoplasm of other parts of uterus: Secondary | ICD-10-CM | POA: Diagnosis not present

## 2023-11-10 DIAGNOSIS — M79604 Pain in right leg: Secondary | ICD-10-CM | POA: Diagnosis present

## 2023-11-10 DIAGNOSIS — M25551 Pain in right hip: Secondary | ICD-10-CM | POA: Insufficient documentation

## 2023-11-10 LAB — CBC WITH DIFFERENTIAL/PLATELET
Abs Immature Granulocytes: 0.02 K/uL (ref 0.00–0.07)
Basophils Absolute: 0 K/uL (ref 0.0–0.1)
Basophils Relative: 0 %
Eosinophils Absolute: 0.1 K/uL (ref 0.0–0.5)
Eosinophils Relative: 2 %
HCT: 42.3 % (ref 36.0–46.0)
Hemoglobin: 13.4 g/dL (ref 12.0–15.0)
Immature Granulocytes: 0 %
Lymphocytes Relative: 14 %
Lymphs Abs: 1.1 K/uL (ref 0.7–4.0)
MCH: 25.9 pg — ABNORMAL LOW (ref 26.0–34.0)
MCHC: 31.7 g/dL (ref 30.0–36.0)
MCV: 81.7 fL (ref 80.0–100.0)
Monocytes Absolute: 0.3 K/uL (ref 0.1–1.0)
Monocytes Relative: 4 %
Neutro Abs: 5.8 K/uL (ref 1.7–7.7)
Neutrophils Relative %: 80 %
Platelets: 227 K/uL (ref 150–400)
RBC: 5.18 MIL/uL — ABNORMAL HIGH (ref 3.87–5.11)
RDW: 17.2 % — ABNORMAL HIGH (ref 11.5–15.5)
WBC: 7.3 K/uL (ref 4.0–10.5)
nRBC: 0 % (ref 0.0–0.2)

## 2023-11-10 LAB — BASIC METABOLIC PANEL WITH GFR
Anion gap: 9 (ref 5–15)
BUN: 9 mg/dL (ref 8–23)
CO2: 21 mmol/L — ABNORMAL LOW (ref 22–32)
Calcium: 9.5 mg/dL (ref 8.9–10.3)
Chloride: 109 mmol/L (ref 98–111)
Creatinine, Ser: 0.72 mg/dL (ref 0.44–1.00)
GFR, Estimated: >60 mL/min (ref >60)
Glucose, Bld: 112 mg/dL — ABNORMAL HIGH (ref 70–99)
Potassium: 3.8 mmol/L (ref 3.5–5.1)
Sodium: 139 mmol/L (ref 135–145)

## 2023-11-10 LAB — PROTIME-INR
INR: 1 (ref 0.8–1.2)
Prothrombin Time: 13.5 s (ref 11.4–15.2)

## 2023-11-10 LAB — CK: Total CK: 47 U/L (ref 38–234)

## 2023-11-10 MED ORDER — TRAMADOL HCL 50 MG PO TABS: 50.0000 mg | ORAL_TABLET | Freq: Four times a day (QID) | ORAL | 0 refills | Status: DC | PRN

## 2023-11-10 MED ORDER — KETOROLAC TROMETHAMINE 30 MG/ML IJ SOLN
15.0000 mg | Freq: Once | INTRAMUSCULAR | Status: DC
  Filled 2023-11-10: qty 1

## 2023-11-10 MED ORDER — KETOROLAC TROMETHAMINE 30 MG/ML IJ SOLN
30.0000 mg | Freq: Once | INTRAMUSCULAR | Status: AC
  Administered 2023-11-10: 30 mg via INTRAMUSCULAR

## 2023-11-10 NOTE — Discharge Instructions (Addendum)
 You were seen in the emergency department for right leg pain.  You had blood work and an ultrasound of your leg that did not show any evidence of a blood clot.  Please use ibuprofen 3 times a day and we are prescribing you a short course of pain medicine.  Follow-up with your regular doctor.  Return to the emergency department if any worsening or concerning symptoms

## 2023-11-10 NOTE — ED Notes (Signed)
 Patient transported to X-ray

## 2023-11-10 NOTE — ED Triage Notes (Signed)
 Pt arrives to ED POV with complaints of pain in her right leg. She reports swelling and difficulty bearing weight. Symptoms have been present for 2 weeks. No other complaints reported. Pt ambulatory w/ difficulty. AOx4.

## 2023-11-10 NOTE — ED Notes (Addendum)
 Attempted to help pt into wheelchair for discharge and pt remains in physical pain, states 6/10 and pain with movement. Pt states pain is primarily in R hip. Dr Towana notified.

## 2023-11-10 NOTE — ED Provider Notes (Signed)
 [facility] AT [facility] Provider Note   CSN: [id number] Arrival date & time: 11/10/23  9190     Patient presents with: No chief complaint on file.   Carolyn Bradley is a 68 y.o. female.  She has a history of endometrial cancer.  She is here with a complaint of 2 weeks of right leg pain.  Started on her foot and is moving into her calf and sometimes radiates up to her hip.  Today was the most severe.  She still able to ambulate.  No numbness or weakness.  Has not seen her doctor for it.  No trauma.  No chest pain or shortness of breath no fevers or chills.  No back pain.   The history is provided by the patient.  Leg Pain Location:  Leg Time since incident:  2 weeks Injury: no   Leg location:  R leg Pain details:    Quality:  Aching   Severity:  Severe   Onset quality:  Gradual   Timing:  Constant   Progression:  Worsening Chronicity:  New Relieved by:  Nothing Worsened by:  Activity Ineffective treatments:  Rest and elevation Associated symptoms: swelling   Associated symptoms: no back pain, no fever, no numbness and no tingling        Prior to Admission medications   Not on File    Allergies: Menthol and Gadolinium    Review of Systems  Constitutional:  Negative for fever.  Musculoskeletal:  Negative for back pain.    Updated Vital Signs BP (!) 188/95 (BP Location: Left Arm)   Pulse 70   Temp 97.7 F (36.5 C)   Resp 16   SpO2 98%   Physical Exam Vitals and nursing note reviewed.  Constitutional:      General: She is not in acute distress.    Appearance: Normal appearance. She is well-developed.  HENT:     Head: Normocephalic and atraumatic.  Eyes:     Conjunctiva/sclera: Conjunctivae normal.  Cardiovascular:     Rate and Rhythm: Normal rate and regular rhythm.     Heart sounds: No murmur heard. Pulmonary:     Effort: Pulmonary effort is normal. No respiratory distress.     Breath sounds: Normal breath sounds. No  stridor. No wheezing.  Abdominal:     Palpations: Abdomen is soft.     Tenderness: There is no abdominal tenderness. There is no guarding or rebound.  Musculoskeletal:        General: Tenderness present. No deformity.     Cervical back: Neck supple.     Right lower leg: No edema.     Left lower leg: No edema.     Comments: Right lower extremity she has diffuse tenderness lateral right hip and right mid calf.  No significant joint swelling.  No edema.  Distal pulses motor and sensation intact.  Compartments are soft.  No overlying erythema.  Skin:    General: Skin is warm and dry.  Neurological:     General: No focal deficit present.     Mental Status: She is alert.     GCS: GCS eye subscore is 4. GCS verbal subscore is 5. GCS motor subscore is 6.     Sensory: No sensory deficit.     Motor: No weakness.     (all labs ordered are listed, but only abnormal results are displayed) Labs Reviewed - No data to display  EKG: None  Radiology: DG Hip Unilat With  Pelvis 2-3 Views Right Result Date: 11/10/2023 CLINICAL DATA:  hip pain EXAM: DG HIP (WITH OR WITHOUT PELVIS) 2-3V RIGHT COMPARISON:  None Available. FINDINGS: No evidence of pelvic fracture or diastasis.No acute hip fracture or dislocation.Mild joint space loss of both hips. Degenerative disc disease of the lumbar spine.Soft tissues are unremarkable. IMPRESSION: 1. No acute fracture, pelvic bone diastasis, or dislocation. 2. Mild osteoarthritis of both hips. Electronically Signed   By: Rogelia Myers M.D.   On: 11/10/2023 12:05   US  Venous Img Lower Right (DVT Study) Result Date: 11/10/2023 CLINICAL DATA:  leg pain swelling EXAM: RIGHT LOWER EXTREMITY VENOUS DOPPLER ULTRASOUND TECHNIQUE: Gray-scale sonography with graded compression, as well as color Doppler and duplex ultrasound were performed to evaluate the lower extremity deep venous systems from the level of the common femoral vein and including the common femoral, femoral, profunda  femoral, popliteal and calf veins including the posterior tibial, peroneal and gastrocnemius veins when visible. The superficial great saphenous vein was also interrogated. Spectral Doppler was utilized to evaluate flow at rest and with distal augmentation maneuvers in the common femoral, femoral and popliteal veins. COMPARISON:  None Available. FINDINGS: Contralateral Common Femoral Vein: Respiratory phasicity is normal and symmetric with the symptomatic side. No evidence of thrombus. Normal compressibility. Common Femoral Vein: No evidence of thrombus. Normal compressibility, respiratory phasicity and response to augmentation. Saphenofemoral Junction: No evidence of thrombus. Normal compressibility and flow on color Doppler imaging. Profunda Femoral Vein: No evidence of thrombus. Normal compressibility and flow on color Doppler imaging. Femoral Vein: No evidence of thrombus. Normal compressibility, respiratory phasicity and response to augmentation. Popliteal Vein: No evidence of thrombus. Normal compressibility, respiratory phasicity and response to augmentation. Calf Veins: No evidence of thrombus. Normal compressibility and flow on color Doppler imaging. Superficial Great Saphenous Vein: No evidence of thrombus. Normal compressibility. Other Findings:  None. IMPRESSION: Negative for deep venous thrombosis in the right leg. Electronically Signed   By: Rogelia Myers M.D.   On: 11/10/2023 10:13     Procedures   Medications Ordered in the ED - No data to display                                  Medical Decision Making Amount and/or Complexity of Data Reviewed Labs: ordered. Radiology: ordered.  Risk Prescription drug management.   This patient complains of right leg pain; this involves an extensive number of treatment Options and is a complaint that carries with it a high risk of complications and morbidity. The differential includes musculoskeletal pain, fracture, DVT I ordered medication  IM Toradol  and reviewed PMP when indicated. I ordered imaging studies which included venous duplex and x-rays of pelvis and right hip and I independently    visualized and interpreted imaging which showed no acute findings Previous records obtained and reviewed in epic including recent oncology notes Cardiac monitoring reviewed, sinus rhythm Social determinants considered, no significant barriers Critical Interventions: None  After the interventions stated above, I reevaluated the patient and found patient to be neurovascularly intact Admission and further testing considered, no indications for admission but will need close follow-up with her treatment team.  Will prescribe a short course of pain medicine.  Return instructions discussed.      Final diagnoses:  Right leg pain    ED Discharge Orders          Ordered    traMADol  (ULTRAM ) 50 MG tablet  Every 6 hours PRN        11/10/23 1036               Towana Ozell BROCKS, MD 11/10/23 [phone]

## 2023-11-11 ENCOUNTER — Ambulatory Visit (HOSPITAL_COMMUNITY)
Admission: RE | Admit: 2023-11-11 | Discharge: 2023-11-11 | Disposition: A | Discharge status: Home / Self Care | Source: Ambulatory Visit | Attending: Gynecologic Oncology | Admitting: Gynecologic Oncology

## 2023-11-11 ENCOUNTER — Telehealth: Payer: Self-pay | Admitting: *Deleted

## 2023-11-11 ENCOUNTER — Other Ambulatory Visit: Payer: Self-pay | Admitting: Gynecologic Oncology

## 2023-11-11 ENCOUNTER — Ambulatory Visit: Payer: Self-pay

## 2023-11-11 DIAGNOSIS — C541 Malignant neoplasm of endometrium: Secondary | ICD-10-CM

## 2023-11-11 DIAGNOSIS — M79604 Pain in right leg: Secondary | ICD-10-CM | POA: Diagnosis present

## 2023-11-11 DIAGNOSIS — R600 Localized edema: Secondary | ICD-10-CM

## 2023-11-11 MED ORDER — IOHEXOL 300 MG/ML  SOLN
100.0000 mL | Freq: Once | INTRAMUSCULAR | Status: AC | PRN
Start: 2023-11-11 — End: 2023-11-11
  Administered 2023-11-11: 100 mL via INTRAVENOUS

## 2023-11-11 NOTE — Telephone Encounter (Signed)
 I would recommend some abdominal/pelvic imaging (CT) to rule out pelvic disease along the side wall as being source of unilateral swelling

## 2023-11-11 NOTE — Telephone Encounter (Signed)
 Spoke with Carolyn Bradley who called the office stating she went to the ED yesterday, complaining of Right leg swelling and pain. States they did an x-ray that was negative, and she was also negative for blood clot. They gave her a shot in her arm for pain and then a Rx for tramadol  and it has done nothing to relieve her pain.  Pt reports no injury to her leg, and states the swelling started about 2.5 weeks ago with swelling in the top of her right foot. She elevated her leg and that helped somewhat but the swelling gradually became worse and her leg continued to get tighter now her entire right leg from her hip to her toes is swollen twice the size of her left leg and she is experiencing what she describes as an electric shock going through her leg & body  Pt is status post RATH, BSO, Sentinel node biopsy. 04/08/2023.  Pt states she did not have radiation treatments.   Advised patient her message will be relayed to providers and the office will call back with recommendations.

## 2023-11-11 NOTE — Telephone Encounter (Signed)
 Spoke with Ms. Groseclose and relayed information that we have scheduled her for a CT scan of abdomen/pelvis this afternoon  for 5 pm at [facility]. Pt is to arrive by 2:45 for check in, 3 pm for oral contrast. Pt is to have only liquids after 1 pm. Pt verbalized understanding and thanked the office for calling.

## 2023-11-11 NOTE — Telephone Encounter (Signed)
 No - she can go home

## 2023-11-11 NOTE — Progress Notes (Signed)
 Patient recently in the ER with new right leg pain and edema. Doppler performed along with xray. Given high grade endometrial cancer hx, plan for CT scan to evaluate for recurrence.

## 2023-11-11 NOTE — Telephone Encounter (Signed)
 FYI Only or Action Required?: Action required by provider: request for appointment.  Patient was last seen in primary care on 09/29/2023 by Paseda, Folashade R, FNP.  Called Nurse Triage reporting Leg Pain.  Symptoms began a week ago.  Interventions attempted: Prescription medications: tramadol .  Symptoms are: gradually worsening.  Triage Disposition: See HCP Within 4 Hours (Or PCP Triage)  Patient/caregiver understands and will follow disposition?: Yes, will follow disposition  Copied from CRM [id number]. Topic: Clinical - Red Word Triage >> Nov 11, 2023  9:00 AM Elle L wrote: Red Word that prompted transfer to Nurse Triage: The patient went to the Emergency Room yesterday due to her right leg being swollen from her foot to her hip. She had an electric shock shoot through her and left her paralyzed for a short time. They told her to have her Tramadol  refilled and they gave her a shot. The patient states she has tossed and turned all night and she is still in severe pain. Reason for Disposition  [1] SEVERE pain (e.g., excruciating, unable to do any normal activities) AND [2] not improved after 2 hours of pain medicine  Answer Assessment - Initial Assessment Questions 1. ONSET: When did the pain start?      2.5 weeks, gradual onset 2. LOCATION: Where is the pain located?      R leg pain and swelling 3. PAIN: How bad is the pain?    (Scale 1-10; or mild, moderate, severe)   -  MILD (1-3): doesn't interfere with normal activities    -  MODERATE (4-7): interferes with normal activities (e.g., work or school) or awakens from sleep, limping    -  SEVERE (8-10): excruciating pain, unable to do any normal activities, unable to walk     10 5. CAUSE: What do you think is causing the leg pain?     unsure 6. OTHER SYMPTOMS: Do you have any other symptoms? (e.g., chest pain, back pain, breathing difficulty, swelling, rash, fever, numbness, weakness)     swelling   Pt was in ED yesterday  and had US , per pt US  was negative for DVT.  Protocols used: Leg Pain-A-AH

## 2023-11-12 ENCOUNTER — Telehealth: Payer: Self-pay | Admitting: *Deleted

## 2023-11-12 NOTE — Telephone Encounter (Signed)
-----   Message from Eleanor JONETTA Epps sent at 11/12/2023 10:31 AM EDT ----- Please call this patient and find out how the swelling and leg pain is. Dr. Viktoria mentioned edema was not documented in the ER exam.  Please let her know her CT scan results: 1) the liver appears fatty. Follow up with PCP on this. Excess fat is being stored in the liver. 2)small cyst seen with left kidney. 3) There is diverticulosis (out pouching of the colon) but not itis (inflammation).  4) There are some arthritis changes.  OVERALL, NO GROSS EVIDENCE OF CANCER RETURN OR SPREAD.   WOULD FOLLOW UP WITH PCP ON LEG ISSUES

## 2023-11-12 NOTE — Telephone Encounter (Signed)
 Spoke with Ms. Sieler. Pt states her right leg is feeling better and the swelling has gone down. She can walk and put pressure on it and her toes are just a little numb. Pt reports taking equate arthritis over the counter pain medication 650 mg twice daily and it's helping.   Relayed message from Eleanor Epps, NP that pt's CT scan results show the liver appears fatty. Excess fat is being stored in the liver. Small cyst seen with left kidney. There is diverticulosis (out pouching of the colon) but not diverticulitis (inflammation). And there are some arthritis changes. Overall, no gross evidence of cancer return or spread. Would follow up with PCP on leg issues and above fatty liver. Pt verbalized understanding and states she has a follow up appointment with her PCP on July 29 th. Pt thanked the office for calling.

## 2023-11-17 ENCOUNTER — Telehealth: Payer: Self-pay

## 2023-11-17 NOTE — Transitions of Care (Post Inpatient/ED Visit) (Signed)
   11/17/2023  Name: Carolyn Bradley MRN: [id number] DOB: 1956/02/21  Today's TOC FU Call Status:   Patient's Name and Date of Birth confirmed.  Transition Care Management Follow-up Telephone Call Date of Discharge: 11/10/23 [facility] Type of Discharge: Emergency Department Reason for ED Visit: Other: How have you been since you were released from the hospital?: Same Any questions or concerns?: No  Items Reviewed: Did you receive and understand the discharge instructions provided?: No Medications obtained,verified, and reconciled?: Yes (Medications Reviewed) Any new allergies since your discharge?: No Dietary orders reviewed?: NA Do you have support at home?: No  Medications Reviewed Today: Medications Reviewed Today     Reviewed by Starlene Charlynn BIRCH, CMA (Certified Medical Assistant) on 11/17/23 at 0956  Med List Status: <None>   Medication Order Taking? Sig Documenting Provider Last Dose Status Informant  traMADol  (ULTRAM ) 50 MG tablet [id number] Yes Take 1 tablet (50 mg total) by mouth every 6 (six) hours as needed. Towana Ozell BROCKS, MD  Active             Home Care and Equipment/Supplies: Were Home Health Services Ordered?: No Any new equipment or medical supplies ordered?: No  Functional Questionnaire: Do you need assistance with bathing/showering or dressing?: No Do you need assistance with meal preparation?: No Do you need assistance with eating?: No Do you have difficulty maintaining continence: No Do you need assistance with getting out of bed/getting out of a chair/moving?: No Do you have difficulty managing or taking your medications?: No  Follow up appointments reviewed: PCP Follow-up appointment confirmed?: Yes Date of PCP follow-up appointment?: 12/01/23 Follow-up Provider: [facility] Follow-up appointment confirmed?: NA Do you need transportation to your follow-up appointment?: No Do you understand care  options if your condition(s) worsen?: Yes-patient verbalized understanding    SIGNATURE Ciel Chervenak, RMA

## 2023-11-30 ENCOUNTER — Telehealth: Payer: Self-pay | Admitting: Nurse Practitioner

## 2023-11-30 NOTE — Telephone Encounter (Signed)
 Copied from CRM [id number]. Topic: General - Other >> Nov 30, 2023  9:12 AM Avram MATSU wrote: Reason for CRM: pt stated she need a return to work letter and she stated pease don't not put any medical information just not able to do heavy lifting. Please call pt when the letter is ready.[phone]

## 2023-12-01 ENCOUNTER — Ambulatory Visit: Payer: Self-pay | Admitting: Nurse Practitioner

## 2023-12-01 ENCOUNTER — Telehealth: Payer: Self-pay | Admitting: Nurse Practitioner

## 2023-12-01 ENCOUNTER — Telehealth: Payer: Self-pay

## 2023-12-01 NOTE — Telephone Encounter (Signed)
 LVM for pt to call back to go over the need for a letter. KH

## 2023-12-01 NOTE — Telephone Encounter (Signed)
 Pt will wait until 12/29/23 apt to get return to work letter. She was asked twice and confirmed she will wait. KH

## 2023-12-01 NOTE — Telephone Encounter (Signed)
 Copied from CRM #[id number]. Topic: General - Call Back - No Documentation >> Dec 01, 2023 10:16 AM Wess RAMAN wrote: Reason for CRM: Patient missed call from Victory Iha, RMA and would like a call back \  Callback #: [phone]

## 2023-12-29 ENCOUNTER — Ambulatory Visit (INDEPENDENT_AMBULATORY_CARE_PROVIDER_SITE_OTHER): Payer: Self-pay | Admitting: Nurse Practitioner

## 2023-12-29 ENCOUNTER — Encounter: Payer: Self-pay | Admitting: Nurse Practitioner

## 2023-12-29 VITALS — BP 157/77 | HR 63 | Temp 96.8°F | Wt 248.0 lb

## 2023-12-29 DIAGNOSIS — M169 Osteoarthritis of hip, unspecified: Secondary | ICD-10-CM | POA: Insufficient documentation

## 2023-12-29 DIAGNOSIS — I10 Essential (primary) hypertension: Secondary | ICD-10-CM | POA: Diagnosis not present

## 2023-12-29 DIAGNOSIS — Z90710 Acquired absence of both cervix and uterus: Secondary | ICD-10-CM | POA: Diagnosis not present

## 2023-12-29 DIAGNOSIS — K760 Fatty (change of) liver, not elsewhere classified: Secondary | ICD-10-CM | POA: Insufficient documentation

## 2023-12-29 DIAGNOSIS — M160 Bilateral primary osteoarthritis of hip: Secondary | ICD-10-CM

## 2023-12-29 DIAGNOSIS — Z68.41 Body mass index (BMI) pediatric, greater than or equal to 140% of the 95th percentile for age: Secondary | ICD-10-CM

## 2023-12-29 DIAGNOSIS — M79604 Pain in right leg: Secondary | ICD-10-CM | POA: Insufficient documentation

## 2023-12-29 NOTE — Assessment & Plan Note (Signed)
 BP Readings from Last 3 Encounters:  12/29/23 (!) 157/77  11/10/23 (!) 169/72  10/09/23 117/63   Hypertension Office BP 157/70 mmHg, home readings 120/60 to 117/60 mmHg suggest white coat syndrome. No current symptoms of chest pain, shortness of breath, or swelling.

## 2023-12-29 NOTE — Patient Instructions (Signed)

## 2023-12-29 NOTE — Progress Notes (Addendum)
 Acute Office Visit  Subjective:     Patient ID: Carolyn Bradley, female    DOB: Mar 31, 1956, 68 y.o.   MRN: [id number]  Chief Complaint  Patient presents with   Consult    HPI Discussed the use of AI scribe software for clinical note transcription with the patient, who gave verbal consent to proceed.  History of Present Illness Carolyn Bradley is a 68 year old female  has a past medical history of Complication of anesthesia, Dyslipidemia (09/29/2023), Endometrial cancer (HCC), Enlarged uterus, Genital pruritus, Obesity, Class III, BMI 40-49.9 (morbid obesity), Post-menopausal bleeding, and Prediabetes (09/29/2023). 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 She previously worked in [employer] and now chooses her clients based on her ability to manage their care needs.She is scheduled to see her gyn oncology next month.  She has a history of high blood pressure. However, her blood pressure readings at home are typically between 120/60 and 117/60 mmHg, indicating possible 'white coat syndrome'. No chest pain, shortness of breath, or swelling at present.  In July, she experienced significant swelling in her right leg, which has since resolved. She visited the emergency room at that time, where x-rays, an ultrasound, and a CT scan were performed, ruling out deep vein thrombosis and other serious conditions. She was prescribed tramadol  for pain, which was ineffective. She has a history of mild osteoarthritis in both hips and degenerative disease of the lumbar spine.  Fatty liver noted on recent imaging , which she attributes to weight gain over the years. She denies alcohol consumption and states that she does not consume fried foods. Her weight has increased from her usual 135-145  pounds,  She is not currently taking any medications.    Assessment & Plan     Review of Systems  Constitutional:  Negative for appetite change, chills, fatigue and fever.  HENT:  Negative for congestion, postnasal drip, rhinorrhea and sneezing.   Respiratory:  Negative for cough, shortness of breath and wheezing.   Cardiovascular:  Negative for chest pain, palpitations and leg swelling.  Gastrointestinal:  Negative for abdominal pain, constipation, nausea and vomiting.  Genitourinary:  Negative for difficulty urinating, dysuria, flank pain and frequency.  Musculoskeletal:  Negative for arthralgias, back pain, joint swelling and myalgias.  Skin:  Negative for color change, pallor, rash and wound.  Neurological:  Negative for dizziness, facial asymmetry, weakness, numbness and headaches.  Psychiatric/Behavioral:  Negative for behavioral problems, confusion, self-injury and suicidal ideas.         Objective:    BP (!) 157/77   Pulse 63   Temp (!) 96.8 F (36 C)   Wt 248 lb (112.5 kg)   SpO2 100%   BMI 37.71 kg/m    Physical Exam Vitals and nursing note reviewed.  Constitutional:      General: She is not in acute distress.    Appearance: Normal appearance. She is obese. She is not ill-appearing, toxic-appearing or diaphoretic.  HENT:     Mouth/Throat:     Pharynx: Oropharynx is clear.  Eyes:     General: No scleral icterus.       Right eye: No discharge.        Left eye: No discharge.     Extraocular  Movements: Extraocular movements intact.     Conjunctiva/sclera: Conjunctivae normal.  Cardiovascular:     Rate and Rhythm: Normal rate and regular rhythm.     Pulses: Normal pulses.     Heart sounds: Normal heart sounds. No murmur heard.    No friction rub. No gallop.  Pulmonary:     Effort: Pulmonary effort is normal. No respiratory distress.     Breath sounds: Normal breath sounds. No stridor. No wheezing, rhonchi or rales.  Chest:     Chest wall: No tenderness.   Abdominal:     General: There is no distension.     Palpations: Abdomen is soft.     Tenderness: There is no abdominal tenderness. There is no right CVA tenderness, left CVA tenderness or guarding.  Musculoskeletal:        General: No swelling, tenderness, deformity or signs of injury.     Right lower leg: No edema.     Left lower leg: No edema.  Skin:    General: Skin is warm and dry.     Capillary Refill: Capillary refill takes less than 2 seconds.     Coloration: Skin is not jaundiced or pale.     Findings: No bruising, erythema or lesion.  Neurological:     Mental Status: She is alert and oriented to person, place, and time.     Motor: No weakness.     Gait: Gait normal.  Psychiatric:        Mood and Affect: Mood normal.        Behavior: Behavior normal.        Thought Content: Thought content normal.        Judgment: Judgment normal.     No results found for any visits on 12/29/23.      Assessment & Plan:   Problem List Items Addressed This Visit       Cardiovascular and Mediastinum   High blood pressure   BP Readings from Last 3 Encounters:  12/29/23 (!) 157/77  11/10/23 (!) 169/72  10/09/23 117/63   Hypertension Office BP 157/70 mmHg, home readings 120/60 to 117/60 mmHg suggest white coat syndrome. No current symptoms of chest pain, shortness of breath, or swelling.          Digestive   Fatty liver    Fatty liver most likely due to obesity. No alcohol consumption, reports not eating fried foods. Lifestyle modifications recommended. - Advised lifestyle changes: weight loss, avoid fatty/fried foods, reduce sugar intake.         Musculoskeletal and Integument   Hip osteoarthritis   Osteoarthritis of both hips Mild osteoarthritis in both hips. No current pain. Previous imaging ruled out fractures.         Other   Status post laparoscopic hysterectomy - Primary   Return to Work Clearance She sought clearance to return to work post-surgery,  feeling not fully ready but financially obligated. Works in home care, selectively choosing clients to avoid overexertion. - Provided return to work clearance note      Obesity   Wt Readings from Last 3 Encounters:  12/29/23 248 lb (112.5 kg)  11/10/23 264 lb 8.8 oz (120 kg)  10/09/23 263 lb (119.3 kg)   Body mass index is 37.71 kg/m.   Obesity contributing to fatty liver. Weight gain over years from 135-145 lbs. Lifestyle modifications recommended. - Advised lifestyle changes: weight loss, avoid fatty/fried foods, reduce sugar  intake. - Encouraged 30 minutes of moderate exercise, five days a  week.       Right leg pain   Now resolved        No orders of the defined types were placed in this encounter.   No follow-ups on file.  Adrianna Dudas R Bence Trapp, FNP

## 2023-12-29 NOTE — Assessment & Plan Note (Signed)
 Return to Work Clearance She sought clearance to return to work post-surgery, feeling not fully ready but financially obligated. Works in home care, selectively choosing clients to avoid overexertion. - Provided return to work clearance note

## 2023-12-29 NOTE — Assessment & Plan Note (Signed)
 Osteoarthritis of both hips Mild osteoarthritis in both hips. No current pain. Previous imaging ruled out fractures.

## 2023-12-29 NOTE — Assessment & Plan Note (Signed)
 Now resolved.

## 2023-12-29 NOTE — Assessment & Plan Note (Addendum)
 Wt Readings from Last 3 Encounters:  12/29/23 248 lb (112.5 kg)  11/10/23 264 lb 8.8 oz (120 kg)  10/09/23 263 lb (119.3 kg)   Body mass index is 37.71 kg/m.   Obesity contributing to fatty liver. Weight gain over years from 135-145 lbs. Lifestyle modifications recommended. - Advised lifestyle changes: weight loss, avoid fatty/fried foods, reduce sugar  intake. - Encouraged 30 minutes of moderate exercise, five days a week.

## 2023-12-29 NOTE — Assessment & Plan Note (Addendum)
  Fatty liver most likely due to obesity. No alcohol consumption, reports not eating fried foods. Lifestyle modifications recommended. - Advised lifestyle changes: weight loss, avoid fatty/fried foods, reduce sugar intake.

## 2024-01-14 ENCOUNTER — Telehealth: Payer: Self-pay

## 2024-01-14 ENCOUNTER — Inpatient Hospital Stay: Admitting: Gynecologic Oncology

## 2024-01-14 DIAGNOSIS — C541 Malignant neoplasm of endometrium: Secondary | ICD-10-CM

## 2024-01-14 NOTE — Progress Notes (Unsigned)
 Patient cancelled the appt day of

## 2024-01-14 NOTE — Telephone Encounter (Signed)
 Ms.Leider called stating she has an appointment with Dr. Viktoria this afternoon at 1:15. Reports getting a late start and it has her getting here at around 1:50.   Per Eleanor Epps NP, she was rescheduled to see Dr.Jackson-Moore on 9/24. Pt voiced an understanding and was agreeable to date/time

## 2024-01-27 ENCOUNTER — Encounter: Payer: Self-pay | Admitting: Obstetrics & Gynecology

## 2024-01-27 ENCOUNTER — Inpatient Hospital Stay: Attending: Obstetrics & Gynecology | Admitting: Obstetrics & Gynecology

## 2024-01-27 VITALS — BP 148/78 | HR 77 | Temp 98.5°F | Resp 18 | Ht 68.0 in | Wt 248.8 lb

## 2024-01-27 DIAGNOSIS — C541 Malignant neoplasm of endometrium: Secondary | ICD-10-CM | POA: Diagnosis present

## 2024-01-27 NOTE — Assessment & Plan Note (Signed)
 Carolyn Bradley is a 68 y.o. 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  MMRd, MLH1 promoter hypermethylation present. CARIS: MMRd, MSI-H. BRCA1, PTEN mutation.  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   Patient is overall doing well, NED on exam today.     Per NCCN surveillance recommendations, we discussed plan for visits every 3 months in the setting of her high-immediate risk endometrial cancer.  We reviewed signs and symptoms that would be concerning for possible recurrence, and I stressed the importance of calling between visits if she develops any of the symptoms.

## 2024-01-27 NOTE — Patient Instructions (Signed)
 Return in 3 months.

## 2024-01-27 NOTE — Progress Notes (Signed)
 Follow Up Note: Gyn-Onc  Carolyn Bradley 68 y.o. female  CC: Surveillance   HPI: The oncology history was reviewed.  Interval History: She denies any vaginal bleeding, abdominal/pelvic pain, cough, lethargy or abdominal distention.      Review of prior data: 6/25 MRI liver: Adrenals/Urinary Tract: 1.2 cm left adrenal nodule is seen which shows signal dropout on chemical shift imaging, consistent with a benign adenoma (No followup imaging is recommended) .  Review of Systems  Review of Systems  Constitutional:  Negative for malaise/fatigue and weight loss.  Respiratory:  Negative for shortness of breath and wheezing.   Cardiovascular:  Negative for chest pain and leg swelling.  Gastrointestinal:  Negative for abdominal pain, blood in stool, constipation, nausea and vomiting.  Genitourinary:  Negative for dysuria, frequency, hematuria and urgency.  Musculoskeletal:  Negative for joint pain and myalgias.  Neurological:  Negative for weakness.  Psychiatric/Behavioral:  Negative for depression. The patient does not have insomnia.    Current medications, allergy, social history, past surgical history, past medical history, family history were all reviewed.    Vitals:  BP (!) 148/78 Comment: manual recheck, MD notified. no S&S will monitor and f/u with PCP  Pulse 77   Temp 98.5 F (36.9 C) (Oral)   Resp 18   Ht 5' 8 (1.727 m)   Wt 248 lb 12.8 oz (112.9 kg)   SpO2 100%   BMI 37.83 kg/m    Physical Exam:  Physical Exam Exam conducted with a chaperone present.  Constitutional:      General: She is not in acute distress. Cardiovascular:     Rate and Rhythm: Normal rate and regular rhythm.  Pulmonary:     Effort: Pulmonary effort is normal.     Breath sounds: Normal breath sounds. No wheezing or rhonchi.  Abdominal:     Palpations: Abdomen is soft.     Tenderness: There is no abdominal tenderness. There is no right CVA tenderness or left CVA tenderness.     Hernia: No  hernia is present.  Genitourinary:    General: Normal vulva.     Urethra: No urethral lesion.     Vagina: No lesions. No bleeding Musculoskeletal:     Cervical back: Neck supple.     Right lower leg: No edema.     Left lower leg: No edema.  Lymphadenopathy:     Upper Body:     Right upper body: No supraclavicular adenopathy.     Left upper body: No supraclavicular adenopathy.     Lower Body: No right inguinal adenopathy. No left inguinal adenopathy.  Skin:    Findings: No rash.  Neurological:     Mental Status: She is oriented to person, place, and time.   Assessment/Plan:.  Endometrial cancer (HCC) Carolyn Bradley is a 68 y.o. 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  MMRd, MLH1 promoter hypermethylation present. CARIS: MMRd, MSI-H. BRCA1, PTEN mutation.  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   Patient is overall doing well, NED on exam today.     Per NCCN surveillance recommendations, we discussed plan for visits every 3 months in the setting of her high-immediate risk endometrial cancer.  We reviewed signs and symptoms that would be concerning for possible recurrence, and I stressed the importance of calling between  visits if she develops any of the symptoms.   I personally spent 25 minutes face-to-face and non-face-to-face in the care of this patient, which includes all pre, intra, and post visit time on the date of service.    [place], MD

## 2024-01-28 ENCOUNTER — Telehealth: Payer: Self-pay

## 2024-01-28 NOTE — Telephone Encounter (Signed)
 Ms.Rego called stating she would like to reschedule her December appointment from [facility] to see Dr.Tucker.   Appointment rescheduled for 12/5 @ 3:00. Pt agreed to date/time.

## 2024-02-09 ENCOUNTER — Ambulatory Visit: Payer: Self-pay

## 2024-04-04 ENCOUNTER — Encounter: Payer: Self-pay | Admitting: Nurse Practitioner

## 2024-04-04 ENCOUNTER — Ambulatory Visit (INDEPENDENT_AMBULATORY_CARE_PROVIDER_SITE_OTHER): Payer: Self-pay | Admitting: Nurse Practitioner

## 2024-04-04 VITALS — BP 159/83 | HR 68 | Wt 250.0 lb

## 2024-04-04 DIAGNOSIS — Z6838 Body mass index (BMI) 38.0-38.9, adult: Secondary | ICD-10-CM

## 2024-04-04 DIAGNOSIS — I10 Essential (primary) hypertension: Secondary | ICD-10-CM | POA: Diagnosis not present

## 2024-04-04 DIAGNOSIS — E785 Hyperlipidemia, unspecified: Secondary | ICD-10-CM

## 2024-04-04 DIAGNOSIS — E66812 Obesity, class 2: Secondary | ICD-10-CM

## 2024-04-04 DIAGNOSIS — C541 Malignant neoplasm of endometrium: Secondary | ICD-10-CM

## 2024-04-04 DIAGNOSIS — R7303 Prediabetes: Secondary | ICD-10-CM

## 2024-04-04 DIAGNOSIS — Z0000 Encounter for general adult medical examination without abnormal findings: Secondary | ICD-10-CM | POA: Diagnosis not present

## 2024-04-04 NOTE — Progress Notes (Signed)
 Complete physical exam  Patient: Carolyn Bradley   DOB: [birth year]   68 y.o. Female  MRN: [id number]  Subjective:    Chief Complaint  Patient presents with   Annual Exam    fasting      Discussed the use of AI scribe software for clinical note transcription with the patient, who gave verbal consent to proceed.  History of Present Illness Carolyn Bradley is a 68 year old female  has a past medical history of Complication of anesthesia, Dyslipidemia (09/29/2023), Endometrial cancer (HCC), Enlarged uterus, Genital pruritus, Obesity, Class III, BMI 40-49.9 (morbid obesity) (HCC), Post-menopausal bleeding, and Prediabetes (09/29/2023). who presents for an annual physical exam.  She has not had an annual wellness visit in over a year and has declined lab work, including cholesterol and diabetes screening, despite a previous A1c of 5.8%.  Her blood pressure is elevated only during doctor visits, with home readings around 120/60 to 119/75. She checks her blood pressure daily using a manual device. She maintains an active lifestyle, engaging in activities such as yard work and walking 2-3 miles daily.  Her diet is regular without specific restrictions, and she has gained two pounds recently, attributing it to the holidays. She does not consume soda and prefers water . She reports no issues with sleep, getting adequate rest nightly.  She has declined various vaccinations, including flu, pneumonia, Tdap, and shingles vaccines, as well as preventive screenings like mammograms, bone density tests, and colonoscopies.  She does not smoke, drink alcohol, or use vaping products. She also does not participate in MyChart, expressing a preference not to use it.  Assessment & Plan       Most recent fall risk assessment:    12/29/2023    8:46 AM  Fall Risk   Falls in the past year? 0  Number falls in past yr: 0  Injury with Fall? 0  Risk for fall due to : No Fall Risks  Follow up Falls  evaluation completed     Most recent depression screenings:    04/04/2024   11:04 AM 12/29/2023    8:44 AM  PHQ 2/9 Scores  PHQ - 2 Score 0 0  PHQ- 9 Score 0 0      Data saved with a previous flowsheet row definition        Patient Care Team: Sequoia Mincey R, FNP as PCP - General (Nurse Practitioner)   Outpatient Medications Prior to Visit  Medication Sig   silver sulfADIAZINE (SILVADENE) 1 % cream Apply 1 Application topically 2 (two) times daily. (Patient not taking: Reported on 04/04/2024)   No facility-administered medications prior to visit.    Review of Systems  Constitutional:  Negative for appetite change, chills, fatigue and fever.  HENT:  Negative for congestion, postnasal drip, rhinorrhea and sneezing.   Eyes:  Negative for pain, discharge and itching.  Respiratory:  Negative for cough, shortness of breath and wheezing.   Cardiovascular:  Negative for chest pain, palpitations and leg swelling.  Gastrointestinal:  Negative for abdominal pain, constipation, nausea and vomiting.  Endocrine: Negative for cold intolerance, heat intolerance and polydipsia.  Genitourinary:  Negative for difficulty urinating, dysuria, flank pain and frequency.  Musculoskeletal:  Negative for arthralgias, back pain, joint swelling and myalgias.  Skin:  Negative for color change, pallor, rash and wound.  Neurological:  Negative for dizziness, facial asymmetry, weakness, numbness and headaches.  Psychiatric/Behavioral:  Negative for behavioral problems, confusion, self-injury and suicidal ideas.  Objective:     BP (!) 159/83   Pulse 68   Wt 250 lb (113.4 kg)   SpO2 100%   BMI 38.01 kg/m    Physical Exam Vitals and nursing note reviewed.  Constitutional:      General: She is not in acute distress.    Appearance: Normal appearance. She is obese. She is not ill-appearing, toxic-appearing or diaphoretic.  HENT:     Right Ear: Tympanic membrane, ear canal and external  ear normal. There is no impacted cerumen.     Left Ear: Tympanic membrane, ear canal and external ear normal. There is no impacted cerumen.     Nose: Nose normal. No congestion or rhinorrhea.     Mouth/Throat:     Mouth: Mucous membranes are moist.     Pharynx: Oropharynx is clear. No oropharyngeal exudate or posterior oropharyngeal erythema.  Eyes:     General: No scleral icterus.       Right eye: No discharge.        Left eye: No discharge.     Extraocular Movements: Extraocular movements intact.     Conjunctiva/sclera: Conjunctivae normal.  Neck:     Vascular: No carotid bruit.  Cardiovascular:     Rate and Rhythm: Normal rate and regular rhythm.     Pulses: Normal pulses.     Heart sounds: Normal heart sounds. No murmur heard.    No friction rub. No gallop.  Pulmonary:     Effort: Pulmonary effort is normal. No respiratory distress.     Breath sounds: Normal breath sounds. No stridor. No wheezing, rhonchi or rales.  Chest:     Chest wall: No tenderness.  Abdominal:     General: Bowel sounds are normal. There is no distension.     Palpations: Abdomen is soft. There is no mass.     Tenderness: There is no abdominal tenderness. There is no right CVA tenderness, left CVA tenderness, guarding or rebound.     Hernia: No hernia is present.  Musculoskeletal:        General: No swelling, tenderness, deformity or signs of injury.     Cervical back: Normal range of motion and neck supple. No rigidity or tenderness.     Right lower leg: No edema.     Left lower leg: No edema.  Lymphadenopathy:     Cervical: No cervical adenopathy.  Skin:    General: Skin is warm and dry.     Capillary Refill: Capillary refill takes less than 2 seconds.     Coloration: Skin is not jaundiced or pale.     Findings: No bruising or erythema.  Neurological:     Mental Status: She is alert and oriented to person, place, and time.     Cranial Nerves: No cranial nerve deficit.     Motor: No weakness.      Gait: Gait normal.  Psychiatric:        Mood and Affect: Mood normal.        Behavior: Behavior normal.        Thought Content: Thought content normal.        Judgment: Judgment normal.     No results found for any visits on 04/04/24.     Assessment & Plan:    Routine Health Maintenance and Physical Exam   There is no immunization history on file for this patient.  Health Maintenance  Topic Date Due   Medicare Annual Wellness (AWV)  Never done   COVID-19  Vaccine (1) Never done   Hepatitis C Screening  Never done   Zoster Vaccines- Shingrix (1 of 2) Never done   Influenza Vaccine  08/02/2024 (Originally 12/04/2023)   DTaP/Tdap/Td (1 - Tdap) 09/28/2024 (Originally 10/26/1974)   Pneumococcal Vaccine: 50+ Years (1 of 1 - PCV) 09/28/2024 (Originally 10/25/2005)   Mammogram  09/28/2024 (Originally 10/26/1995)   Bone Density Scan  09/28/2024 (Originally 10/25/2020)   Colonoscopy  09/28/2024 (Originally 10/25/2000)   Meningococcal B Vaccine  Aged Out    Discussed health benefits of physical activity, and encouraged her to engage in regular exercise appropriate for her age and condition.  Problem List Items Addressed This Visit       Cardiovascular and Mediastinum   High blood pressure   Blood pressure well-controlled at home, elevated in office likely due to white coat syndrome. No need for antihypertensive medication. - Continue home blood pressure monitoring. - Bring blood pressure machine to next visit for comparison. - Educated on the importance of maintaining blood pressure below 130/80 mmHg. DASH diet and commitment to daily physical activity for a minimum of 30 minutes discussed and encouraged, as a part of hypertension management. The importance of attaining a healthy weight is also discussed.     04/04/2024   11:09 AM 04/04/2024   11:02 AM 01/27/2024   11:31 AM 01/27/2024   11:15 AM 12/29/2023    8:51 AM 12/29/2023    8:42 AM 11/10/2023   12:40 PM  BP/Weight  Systolic BP  159 831 148 164 157 157 169  Diastolic BP 83 82 78 87 77 77 72  Wt. (Lbs)  250  248.8  248   BMI  38.01 kg/m2  37.83 kg/m2  37.71 kg/m2             Genitourinary   Endometrial cancer (HCC)   Followed by oncologist ,encouraged to maintain close follow-up with oncology           Other   Obesity with serious comorbidity   Wt Readings from Last 3 Encounters:  04/04/24 250 lb (113.4 kg)  01/27/24 248 lb 12.8 oz (112.9 kg)  12/29/23 248 lb (112.5 kg)   Body mass index is 38.01 kg/m.  Obesity, class III (morbid obesity) BMI indicates class III obesity with recent weight gain. Discussed weight management to reduce health risks. - Encouraged a diet with 70-80% vegetables and protein, reduced carbohydrates. - Promoted regular physical activity, such as moderate exercises like fast walking 30 minutes 5 days a week as tolerated            Dyslipidemia   Lab Results  Component Value Date   CHOL 204 (H) 03/31/2023   HDL 62 03/31/2023   LDLCALC 125 (H) 03/31/2023   TRIG 96 03/31/2023   CHOLHDL 3.3 03/31/2023   Discussed importance of monitoring cholesterol. - Continue routine monitoring of cholesterol levels.declined labs today       Prediabetes   Lab Results  Component Value Date   HGBA1C 5.8 (H) 03/31/2023  Patient counseled on low-carb diet,, lose weight Declined labs today      Annual physical exam - Primary   Annual exam as documented.  Counseling done include healthy lifestyle involving committing to 150 minutes of exercise per week, heart healthy diet, and attaining healthy weight. The importance of adequate sleep also discussed.  Regular use of seat belt and home safety were also discussed . Changes in health habits are decided on by patient with goals and time frames set for  achieving them. Immunization and cancer screening  needs are specifically addressed at this visit.   Patient declined all vaccines due declined mammogram and colonoscopy Declined breast  exam      Return in about 1 year (around 04/04/2025) for CPE.     Kenyette Gundy R Fitzhugh Vizcarrondo, FNP

## 2024-04-04 NOTE — Assessment & Plan Note (Signed)
 Lab Results  Component Value Date   HGBA1C 5.8 (H) 03/31/2023  Patient counseled on low-carb diet,, lose weight Declined labs today

## 2024-04-04 NOTE — Patient Instructions (Signed)
 Around 3 times per week, check your blood pressure 2 times per day. once in the morning and once in the evening. The readings should be at least one minute apart. Write down these values and bring them to your next nurse visit/appointment.  When you check your BP, make sure you have been doing something calm/relaxing 5 minutes prior to checking. Both feet should be flat on the floor and you should be sitting. Use your left arm and make sure it is in a relaxed position (on a table), and that the cuff is at the approximate level/height of your heart.  Please report blood pressure reading consistently greater than 140/90  Let me know if you change your mind about getting labs done to screen for cholesterol and diabetes   It is important that you exercise regularly at least 30 minutes 5 times a week as tolerated  Think about what you will eat, plan ahead. Choose  clean, green, fresh or frozen over canned, processed or packaged foods which are more sugary, salty and fatty. 70 to 75% of food eaten should be vegetables and fruit. Three meals at set times with snacks allowed between meals, but they must be fruit or vegetables. Aim to eat over a 12 hour period , example 7 am to 7 pm, and STOP after  your last meal of the day. Drink water ,generally about 64 ounces per day, no other drink is as healthy. Fruit juice is best enjoyed in a healthy way, by EATING the fruit.  Thanks for choosing [facility] we consider it a privelige to serve you.

## 2024-04-04 NOTE — Assessment & Plan Note (Addendum)
 Followed by oncologist ,encouraged to maintain close follow-up with oncology

## 2024-04-04 NOTE — Assessment & Plan Note (Signed)
 Lab Results  Component Value Date   CHOL 204 (H) 03/31/2023   HDL 62 03/31/2023   LDLCALC 125 (H) 03/31/2023   TRIG 96 03/31/2023   CHOLHDL 3.3 03/31/2023   Discussed importance of monitoring cholesterol. - Continue routine monitoring of cholesterol levels.declined labs today

## 2024-04-04 NOTE — Assessment & Plan Note (Signed)
 Blood pressure well-controlled at home, elevated in office likely due to white coat syndrome. No need for antihypertensive medication. - Continue home blood pressure monitoring. - Bring blood pressure machine to next visit for comparison. - Educated on the importance of maintaining blood pressure below 130/80 mmHg. DASH diet and commitment to daily physical activity for a minimum of 30 minutes discussed and encouraged, as a part of hypertension management. The importance of attaining a healthy weight is also discussed.     04/04/2024   11:09 AM 04/04/2024   11:02 AM 01/27/2024   11:31 AM 01/27/2024   11:15 AM 12/29/2023    8:51 AM 12/29/2023    8:42 AM 11/10/2023   12:40 PM  BP/Weight  Systolic BP 159 168 148 164 157 157 169  Diastolic BP 83 82 78 87 77 77 72  Wt. (Lbs)  250  248.8  248   BMI  38.01 kg/m2  37.83 kg/m2  37.71 kg/m2

## 2024-04-04 NOTE — Assessment & Plan Note (Addendum)
 Wt Readings from Last 3 Encounters:  04/04/24 250 lb (113.4 kg)  01/27/24 248 lb 12.8 oz (112.9 kg)  12/29/23 248 lb (112.5 kg)   Body mass index is 38.01 kg/m.  Obesity, class III (morbid obesity) BMI indicates class III obesity with recent weight gain. Discussed weight management to reduce health risks. - Encouraged a diet with 70-80% vegetables and protein, reduced carbohydrates. - Promoted regular physical activity, such as moderate exercises like fast walking 30 minutes 5 days a week as tolerated

## 2024-04-04 NOTE — Assessment & Plan Note (Addendum)
 Annual exam as documented.  Counseling done include healthy lifestyle involving committing to 150 minutes of exercise per week, heart healthy diet, and attaining healthy weight. The importance of adequate sleep also discussed.  Regular use of seat belt and home safety were also discussed . Changes in health habits are decided on by patient with goals and time frames set for achieving them. Immunization and cancer screening  needs are specifically addressed at this visit.   Patient declined all vaccines due declined mammogram and colonoscopy Declined breast exam

## 2024-04-08 ENCOUNTER — Inpatient Hospital Stay: Attending: Obstetrics & Gynecology | Admitting: Gynecologic Oncology

## 2024-04-08 ENCOUNTER — Encounter: Payer: Self-pay | Admitting: Gynecologic Oncology

## 2024-04-08 VITALS — BP 152/90 | HR 81 | Temp 98.6°F | Resp 20 | Wt 249.0 lb

## 2024-04-08 DIAGNOSIS — Z90710 Acquired absence of both cervix and uterus: Secondary | ICD-10-CM | POA: Insufficient documentation

## 2024-04-08 DIAGNOSIS — Z8542 Personal history of malignant neoplasm of other parts of uterus: Secondary | ICD-10-CM | POA: Insufficient documentation

## 2024-04-08 DIAGNOSIS — Z9079 Acquired absence of other genital organ(s): Secondary | ICD-10-CM | POA: Insufficient documentation

## 2024-04-08 DIAGNOSIS — C541 Malignant neoplasm of endometrium: Secondary | ICD-10-CM

## 2024-04-08 DIAGNOSIS — Z90722 Acquired absence of ovaries, bilateral: Secondary | ICD-10-CM | POA: Insufficient documentation

## 2024-04-08 NOTE — Progress Notes (Signed)
 Gynecologic Oncology Return Clinic Visit  04/08/24  Reason for Visit: follow-up  Treatment History: Oncology History Overview Note  The patient was initially seen on 02/05/2023 for postmenopausal bleeding.  This was described as heavy vaginal bleeding over 4 months.  At that time she denied ever having sensation of her menses but that current, heavier bleeding started in June 2024.  This was described as heavy bleeding with passage of clots initially, requiring the use of 5-6 overnight pads during the day and 1 change overnight.  dMMR P53 WT   Endometrial cancer (HCC)  02/18/2023 Imaging   Pelvic ultrasound exam: Uterus measures 7.7 x 6.5 x 7.2 cm.  Multiple calcified uterine fibroids noted, largest measuring up to 2.5 cm. Endometrium 3.8 cm, diffusely heterogenous. Right ovary normal in appearance.  Left ovary not visualized. No free fluid.   03/16/2023 Surgery   EUA, D&C for persistent PMB    03/16/2023 Pathology Results   Endometrial curettings: Mixed endometrioid and serous carcinoma.  Strong block positivity for p16.  P53 appears wild-type.   03/27/2023 Initial Diagnosis   Endometrial cancer (HCC)   04/03/2023 Imaging   CT C/A/P:  Retroverted uterus with abnormal thickening of the endometrium as described on ultrasound. No specific abnormal lymph node enlargement identified. No parametrial soft tissues. Separate calcified uterine fibroids. No acute cardiopulmonary disease. No dominant lung nodules. No bowel obstruction. Few sigmoid colon diverticula. Benign-appearing left-sided renal cysts. Presumed left adrenal adenoma but not confirmed on this examination. Please correlate with any prior versus MRI in and out of phase evaluation or short follow up   04/08/2023 Surgery   Robotic-assisted laparoscopic total hysterectomy with bilateral salpingoophorectomy, SLN biopsy    04/08/2023 Pathology Results   A. LEFT EXTERNAL ILIAC SENTINEL LYMPH NODE, EXCISION:               One  benign lymph node, negative for carcinoma (0/1)  B. RIGHT INTERNAL ILIAC SENTINEL LYMPH NODE, EXCISION:               One benign lymph node, negative for carcinoma (0/1)  C. 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 OMENTUM, OMENTECTOMY:              Benign omentum, negative for tumor  ONCOLOGY TABLE:  UTERUS, CARCINOMA OR CARCINOSARCOMA: Resection  Procedure: Total hysterectomy and bilateral salpingo-oophorectomy with sentinel nodes omentectomy Histologic Type: Endometrioid adenocarcinoma Histologic Grade: Moderate to poorly differentiated, FIGO 3 Myometrial Invasion:      Depth of Myometrial Invasion (mm): 22 mm      Myometrial Thickness (mm): 30 mm      Percentage of Myometrial Invasion: 73% Uterine Serosa Involvement: Not identified Cervical stromal Involvement: Not identified Extent of involvement of other tissue/organs: Not identified Peritoneal/Ascitic Fluid: Negative ([id number]) Lymphovascular Invasion: Not identified Regional Lymph Nodes:      Pelvic Lymph Nodes Examined: 2 sentinel lymph nodes      Pelvic Lymph Nodes with Metastasis: 0          Macrometastasis: (>2.0 mm): 0  Micrometastasis: (>0.2 mm and < 2.0 mm): 0          Isolated Tumor Cells (<0.2 mm): 0          Laterality of Lymph Node with Tumor: NA          Extracapsular Extension: NA Distant Metastasis: Not applicable Pathologic  Stage Classification (pTNM, AJCC 8th Edition): pT1b, pN0 FIGO Stage (2023 staging for cancer of the endometrium): IIC FIGO Modified Classification: mMMRd Ancillary Studies: MMR testing shows MLH-1 and PMS2 loss Representative Tumor Block: C5, C7 Comment(s): A pancytokeratin immunohistochemical stains performed lymph nodes is negative with adequate control. (v4.2.0.1)  COMMENT:  Immunohistochemical stain for p53 was performed and is wild type pattern of staining.    04/08/2023 Pathology Results   CARIS  MMRd, MSI-H, TMB H (27 mut/Mb) ER(2+)/PR(2+) positive BRCA1 mutation ARID1A, PTEN mutations ERRB2 likely pathogenic variant (IHC 0) KRAS, BRAF, POLE, p53 no mutation     Interval History: Doing well.  Denies any vaginal bleeding or discharge.  Reports baseline bowel and bladder function.  Denies any abdominal or pelvic pain.  Past Medical/Surgical History: Past Medical History:  Diagnosis Date   Complication of anesthesia    needed small tube for intubation   Dyslipidemia 09/29/2023   Endometrial cancer (HCC)    Enlarged uterus    Genital pruritus    Obesity, Class III, BMI 40-49.9 (morbid obesity) (HCC)    Post-menopausal bleeding    Prediabetes 09/29/2023    Past Surgical History:  Procedure Laterality Date   DILATION AND CURETTAGE OF UTERUS  03/16/2023   [facility]   ROBOTIC ASSISTED TOTAL HYSTERECTOMY WITH BILATERAL SALPINGO OOPHERECTOMY N/A 04/08/2023   Procedure: XI ROBOTIC ASSISTED TOTAL HYSTERECTOMY WITH BILATERAL SALPINGO OOPHORECTOMY WITH OMENTECTOMY; CYSTOSCOPY;  Surgeon: Viktoria Comer SAUNDERS, MD;  Location: [facility];  Service: Gynecology;  Laterality: N/A;   SENTINEL NODE BIOPSY N/A 04/08/2023   Procedure: SENTINEL NODE BIOPSY;  Surgeon: Viktoria Comer SAUNDERS, MD;  Location: [facility];  Service: Gynecology;  Laterality: N/A;    Family History  Problem Relation Age of Onset   Cancer Mother    Colon cancer Mother    Cancer Paternal Grandfather     Social  History   Socioeconomic History   Marital status: Widowed    Spouse name: Not on file   Number of children: 2   Years of education: Not on file   Highest education level: Not on file  Occupational History   Not on file  Tobacco Use   Smoking status: Never   Smokeless tobacco: Not on file  Vaping Use   Vaping status: Never Used  Substance and Sexual Activity   Alcohol use: No   Drug use: No   Sexual activity: Not Currently  Other Topics Concern   Not on file  Social History Narrative   Lives home alone    Social Drivers of Health   Financial Resource Strain: Not on file  Food Insecurity: No Food Insecurity (06/01/2023)   Hunger Vital Sign    Worried About Running Out of Food in the Last Year: Never true    Ran Out of Food in the Last Year: Never true  Transportation Needs: No Transportation Needs (06/01/2023)   PRAPARE - Administrator, Civil Service (Medical): No    Lack of Transportation (Non-Medical): No  Physical Activity: Not on file  Stress: Not on file  Social Connections: Not on file    Current Medications: No current outpatient medications on file.  Review of Systems: Denies appetite changes, fevers, chills, fatigue, unexplained weight changes. Denies hearing loss, neck lumps or masses, mouth sores, ringing in ears or voice changes. Denies cough or wheezing.  Denies shortness of breath. Denies chest pain or palpitations. Denies leg swelling. Denies abdominal distention, pain, blood in stools, constipation, diarrhea, nausea, vomiting, or early satiety. Denies pain with intercourse, dysuria, frequency, hematuria or incontinence. Denies hot flashes, pelvic pain, vaginal bleeding or vaginal discharge.   Denies joint pain, back pain or muscle pain/cramps. Denies itching, rash, or wounds. Denies dizziness, headaches, numbness or seizures. Denies swollen lymph nodes or glands, denies easy bruising or bleeding. Denies anxiety, depression, confusion, or  decreased concentration.  Physical Exam: BP (!) 184/83 (BP Location: Left Arm, Patient Position: Sitting)   Pulse 81   Temp 98.6 F (37 C) (Oral)   Resp 20   Wt 249 lb (112.9 kg)   SpO2 99%   BMI 37.86 kg/m  General: Alert, oriented, no acute distress. HEENT: Posterior oropharynx clear, sclera anicteric. Chest: Clear to auscultation bilaterally.  No wheezes or rhonchi. Cardiovascular: Regular rate and rhythm, no murmurs. Abdomen: Obese, soft, nontender.  Normoactive bowel sounds.  No masses or hepatosplenomegaly appreciated.  Well-healed incisions. Extremities: Grossly normal range of motion.  Warm, well perfused.  No edema bilaterally. Skin: No rashes or lesions noted. Lymphatics: No cervical, supraclavicular, or inguinal adenopathy. GU: Normal appearing external genitalia without erythema, excoriation, or lesions.  Speculum exam reveals cuff intact, mild atrophy noted, no lesions.  Bimanual exam reveals no masses or nodularity.  Laboratory & Radiologic Studies: None new  Assessment & Plan: Carolyn Bradley is a 68 y.o. 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  Surgery 04/08/23. MMRd, MLH1 promoter hypermethylation present. CARIS: MMRd, MSI-H. BRCA1, PTEN mutation.  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   Last imaging 11/2023 with no evidence of metastatic disease.  Sigmoid diverticulosis without diverticulitis.  Prior adrenal lesion workup with MRI and consistent with a benign adenoma.  Patient is overall doing well, NED on exam today.   Per NCCN surveillance recommendations, we discussed plan for visits every 3 months in the setting of her high-immediate risk endometrial cancer.  We reviewed signs and  symptoms that would be concerning for possible recurrence, and I stressed the importance of calling between visits if she develops any of the symptoms.  20 minutes of total time was spent for this patient encounter, including preparation, face-to-face counseling with the patient and coordination of care, and documentation of the encounter.  Comer Dollar, MD  Division of Gynecologic Oncology  Department of Obstetrics and Gynecology  [facility]

## 2024-04-08 NOTE — Patient Instructions (Signed)
 It was good to see you today.  I do not see or feel any evidence of cancer recurrence on your exam.  I will see you for follow-up in 3 months.  As always, if you develop any new and concerning symptoms before your next visit, please call to see me sooner.

## 2024-04-13 ENCOUNTER — Ambulatory Visit: Admitting: Obstetrics & Gynecology

## 2024-04-19 ENCOUNTER — Ambulatory Visit: Payer: Self-pay

## 2024-04-19 VITALS — BP 116/80 | Ht 68.0 in | Wt 248.0 lb

## 2024-04-19 DIAGNOSIS — Z0000 Encounter for general adult medical examination without abnormal findings: Secondary | ICD-10-CM

## 2024-04-19 NOTE — Patient Instructions (Signed)
 Carolyn Bradley,  Thank you for taking the time for your Medicare Wellness Visit. I appreciate your continued commitment to your health goals. Please review the care plan we discussed, and feel free to reach out if I can assist you further.  Please note that Annual Wellness Visits do not include a physical exam. Some assessments may be limited, especially if the visit was conducted virtually. If needed, we may recommend an in-person follow-up with your provider.  Ongoing Care Seeing your primary care provider every 3 to 6 months helps us  monitor your health and provide consistent, personalized care.  Referrals If a referral was made during today's visit and you haven't received any updates within two weeks, please contact the referred provider directly to check on the status.  Recommended Screenings:  Health Maintenance  Topic Date Due   Medicare Annual Wellness Visit  Never done   COVID-19 Vaccine (1) Never done   Hepatitis C Screening  Never done   Zoster (Shingles) Vaccine (1 of 2) Never done   Flu Shot  08/02/2024*   DTaP/Tdap/Td vaccine (1 - Tdap) 09/28/2024*   Pneumococcal Vaccine for age over 53 (1 of 1 - PCV) 09/28/2024*   Breast Cancer Screening  09/28/2024*   Osteoporosis screening with Bone Density Scan  09/28/2024*   Colon Cancer Screening  09/28/2024*   Meningitis B Vaccine  Aged Out  *Topic was postponed. The date shown is not the original due date.       04/19/2024    4:30 PM  Advanced Directives  Does Patient Have a Medical Advance Directive? Yes  Type of Estate Agent of [place];Living will;Out of facility DNR (pink MOST or yellow form)  Does patient want to make changes to medical advance directive? No - Patient declined  Copy of Healthcare Power of Attorney in Chart? No - copy requested    Vision: Annual vision screenings are recommended for early detection of glaucoma, cataracts, and diabetic retinopathy. These exams can also reveal signs  of chronic conditions such as diabetes and high blood pressure.  Dental: Annual dental screenings help detect early signs of oral cancer, gum disease, and other conditions linked to overall health, including heart disease and diabetes.  Please see the attached documents for additional preventive care recommendations.

## 2024-04-19 NOTE — Progress Notes (Signed)
 I connected with  Carolyn Bradley on 04/19/2024 by a audio enabled telemedicine application and verified that I am speaking with the correct person using two identifiers.  Patient Location: Home  Provider Location: Home Office  Persons Participating in Visit: Patient.  I discussed the limitations of evaluation and management by telemedicine. The patient expressed understanding and agreed to proceed.  Vital Signs: Because this visit was a virtual/telehealth visit, some criteria may be missing or patient reported. Any vitals not documented were not able to be obtained and vitals that have been documented are patient reported.   This visit was performed by a medical professional under my direct supervision. I was immediately available for consultation/collaboration. I have reviewed and agree with the Annual Wellness Visit documentation.  Chief Complaint  Patient presents with   Medicare Wellness     Subjective:   Carolyn Bradley is a 68 y.o. female who presents for a Medicare Annual Wellness Visit.  Visit info / Clinical Intake: Medicare Wellness Visit Type:: Initial Annual Wellness Visit Persons participating in visit and providing information:: patient Medicare Wellness Visit Mode:: Telephone If telephone:: video declined Since this visit was completed virtually, some vitals may be partially provided or unavailable. Missing vitals are due to the limitations of the virtual format.: Documented vitals are patient reported If Telephone or Video please confirm:: I connected with patient using audio/video enable telemedicine. I verified patient identity with two identifiers, discussed telehealth limitations, and patient agreed to proceed. Patient Location:: home Provider Location:: home office Interpreter Needed?: No Pre-visit prep was completed: yes AWV questionnaire completed by patient prior to visit?: no Living arrangements:: (!) lives alone Patient's Overall Health Status Rating:  very good Typical amount of pain: none Does pain affect daily life?: no Are you currently prescribed opioids?: no  Dietary Habits and Nutritional Risks How many meals a day?: 3 Eats fruit and vegetables daily?: yes Most meals are obtained by: preparing own meals In the last 2 weeks, have you had any of the following?: none Diabetic:: no  Functional Status Activities of Daily Living (to include ambulation/medication): Independent Ambulation: Independent Medication Administration: Independent Home Management (perform basic housework or laundry): Independent Manage your own finances?: yes Primary transportation is: driving Concerns about vision?: no *vision screening is required for WTM* Concerns about hearing?: no  Fall Screening Falls in the past year?: 0 Number of falls in past year: 0 Was there an injury with Fall?: 0 Fall Risk Category Calculator: 0 Patient Fall Risk Level: Low Fall Risk  Fall Risk Patient at Risk for Falls Due to: No Fall Risks Fall risk Follow up: Falls evaluation completed  Home and Transportation Safety: All rugs have non-skid backing?: yes All stairs or steps have railings?: yes Grab bars in the bathtub or shower?: (!) no Have non-skid surface in bathtub or shower?: yes Good home lighting?: yes Regular seat belt use?: yes Hospital stays in the last year:: no  Cognitive Assessment Difficulty concentrating, remembering, or making decisions? : no Will 6CIT or Mini Cog be Completed: yes What year is it?: 0 points What month is it?: 0 points Give patient an address phrase to remember (5 components): remember words apple, table , penny About what time is it?: 0 points Count backwards from 20 to 1: 0 points Say the months of the year in reverse: 0 points Repeat the address phrase from earlier: 0 points 6 CIT Score: 0 points  Advance Directives (For Healthcare) Does Patient Have a Medical Advance Directive?: Yes Does  patient want to make changes  to medical advance directive?: No - Patient declined Type of Advance Directive: Healthcare Power of [place]; Living will; Out of facility DNR (pink MOST or yellow form) Copy of Healthcare Power of Attorney in Chart?: No - copy requested Copy of Living Will in Chart?: No - copy requested Out of facility DNR (pink MOST or yellow form) in Chart? (Ambulatory ONLY): No - copy requested Would patient like information on creating a medical advance directive?: Yes (MAU/Ambulatory/Procedural Areas - Information given)  Reviewed/Updated  Reviewed/Updated: Reviewed All (Medical, Surgical, Family, Medications, Allergies, Care Teams, Patient Goals)    Allergies (verified) Menthol and Gadolinium   Current Medications (verified) No outpatient encounter medications on file as of 04/19/2024.   No facility-administered encounter medications on file as of 04/19/2024.    History: Past Medical History:  Diagnosis Date   Complication of anesthesia    needed small tube for intubation   Dyslipidemia 09/29/2023   Endometrial cancer (HCC)    Enlarged uterus    Genital pruritus    Obesity, Class III, BMI 40-49.9 (morbid obesity) (HCC)    Post-menopausal bleeding    Prediabetes 09/29/2023   Past Surgical History:  Procedure Laterality Date   DILATION AND CURETTAGE OF UTERUS  03/16/2023   [facility]   ROBOTIC ASSISTED TOTAL HYSTERECTOMY WITH BILATERAL SALPINGO OOPHERECTOMY N/A 04/08/2023   Procedure: XI ROBOTIC ASSISTED TOTAL HYSTERECTOMY WITH BILATERAL SALPINGO OOPHORECTOMY WITH OMENTECTOMY; CYSTOSCOPY;  Surgeon: Viktoria Comer SAUNDERS, MD;  Location: [facility];  Service: Gynecology;  Laterality: N/A;   SENTINEL NODE BIOPSY N/A 04/08/2023   Procedure: SENTINEL NODE BIOPSY;  Surgeon: Viktoria Comer SAUNDERS, MD;  Location: [facility];  Service: Gynecology;  Laterality: N/A;   Family History  Problem Relation Age of Onset   Cancer Mother    Colon cancer Mother    Cancer Paternal Grandfather    Social History    Occupational History   Not on file  Tobacco Use   Smoking status: Never   Smokeless tobacco: Not on file  Vaping Use   Vaping status: Never Used  Substance and Sexual Activity   Alcohol use: No   Drug use: No   Sexual activity: Not Currently   Tobacco Counseling Counseling given: Not Answered  SDOH Screenings   Food Insecurity: No Food Insecurity (04/19/2024)  Housing: Low Risk (04/19/2024)  Transportation Needs: No Transportation Needs (04/19/2024)  Utilities: Not At Risk (04/19/2024)  Depression (PHQ2-9): Low Risk (04/19/2024)  Physical Activity: Sufficiently Active (04/19/2024)  Social Connections: Moderately Integrated (04/19/2024)  Stress: No Stress Concern Present (04/19/2024)  Tobacco Use: Unknown (04/19/2024)  Health Literacy: Adequate Health Literacy (04/19/2024)   See flowsheets for full screening details  Depression Screen PHQ 2 & 9 Depression Scale- Over the past 2 weeks, how often have you been bothered by any of the following problems? Little interest or pleasure in doing things: 0 Feeling down, depressed, or hopeless (PHQ Adolescent also includes...irritable): 0 PHQ-2 Total Score: 0 Trouble falling or staying asleep, or sleeping too much: 0 Feeling tired or having little energy: 0 Poor appetite or overeating (PHQ Adolescent also includes...weight loss): 0 Feeling bad about yourself - or that you are a failure or have let yourself or your family down: 0 Trouble concentrating on things, such as reading the newspaper or watching television (PHQ Adolescent also includes...like school work): 0 Moving or speaking so slowly that other people could have noticed. Or the opposite - being so fidgety or restless that you have been moving  around a lot more than usual: 0 Thoughts that you would be better off dead, or of hurting yourself in some way: 0 PHQ-9 Total Score: 0 If you checked off any problems, how difficult have these problems made it for you to do your  work, take care of things at home, or get along with other people?: Not difficult at all     Goals Addressed               This Visit's Progress     Patient Stated (pt-stated)        To be rich. To get weight down             Objective:    Today's Vitals   04/19/24 1627  BP: 116/80  Weight: 248 lb (112.5 kg)  Height: 5' 8 (1.727 m)   Body mass index is 37.71 kg/m.  Hearing/Vision screen Hearing Screening - Comments:: No difficulties  Vision Screening - Comments:: Patient declined  Immunizations and Health Maintenance Health Maintenance  Topic Date Due   Medicare Annual Wellness (AWV)  Never done   COVID-19 Vaccine (1) Never done   Hepatitis C Screening  Never done   Zoster Vaccines- Shingrix (1 of 2) Never done   Influenza Vaccine  08/02/2024 (Originally 12/04/2023)   DTaP/Tdap/Td (1 - Tdap) 09/28/2024 (Originally 10/26/1974)   Pneumococcal Vaccine: 50+ Years (1 of 1 - PCV) 09/28/2024 (Originally 10/25/2005)   Mammogram  09/28/2024 (Originally 10/26/1995)   Bone Density Scan  09/28/2024 (Originally 10/25/2020)   Colonoscopy  09/28/2024 (Originally 10/25/2000)   Meningococcal B Vaccine  Aged Out        Assessment/Plan:  This is a routine wellness examination for Baily.  Patient Care Team: Paseda, Folashade R, FNP as PCP - General (Nurse Practitioner)  I have personally reviewed and noted the following in the patients chart:   Medical and social history Use of alcohol, tobacco or illicit drugs  Current medications and supplements including opioid prescriptions. Functional ability and status Nutritional status Physical activity Advanced directives List of other physicians Hospitalizations, surgeries, and ER visits in previous 12 months Vitals Screenings to include cognitive, depression, and falls Referrals and appointments  No orders of the defined types were placed in this encounter.  In addition, I have reviewed and discussed with patient certain  preventive protocols, quality metrics, and best practice recommendations. A written personalized care plan for preventive services as well as general preventive health recommendations were provided to patient.   Lyle MARLA Right, [place]   04/19/2024   No follow-ups on file.  After Visit Summary: (MyChart) Due to this being a telephonic visit, the after visit summary with patients personalized plan was offered to patient via MyChart   No voiced or noted concerns at this time Vaccines not given: all vaccines  declined today

## 2024-07-29 ENCOUNTER — Inpatient Hospital Stay: Admitting: Gynecologic Oncology

## 2025-04-07 ENCOUNTER — Encounter: Payer: Self-pay | Admitting: Nurse Practitioner
